# Patient Record
Sex: Female | Born: 1960 | Race: White | Hispanic: No | Smoking: Former smoker
Health system: Southern US, Community
[De-identification: ages and names within clinical notes are randomized; demographics above are authoritative.]

## PROBLEM LIST (undated history)

## (undated) DIAGNOSIS — J439 Emphysema, unspecified: Secondary | ICD-10-CM

## (undated) DIAGNOSIS — Z87442 Personal history of urinary calculi: Secondary | ICD-10-CM

## (undated) DIAGNOSIS — F1910 Other psychoactive substance abuse, uncomplicated: Secondary | ICD-10-CM

## (undated) DIAGNOSIS — R7303 Prediabetes: Secondary | ICD-10-CM

## (undated) DIAGNOSIS — J449 Chronic obstructive pulmonary disease, unspecified: Secondary | ICD-10-CM

## (undated) DIAGNOSIS — F32A Depression, unspecified: Secondary | ICD-10-CM

## (undated) DIAGNOSIS — M810 Age-related osteoporosis without current pathological fracture: Secondary | ICD-10-CM

## (undated) DIAGNOSIS — D649 Anemia, unspecified: Secondary | ICD-10-CM

## (undated) DIAGNOSIS — E785 Hyperlipidemia, unspecified: Secondary | ICD-10-CM

## (undated) DIAGNOSIS — E119 Type 2 diabetes mellitus without complications: Secondary | ICD-10-CM

## (undated) DIAGNOSIS — M069 Rheumatoid arthritis, unspecified: Secondary | ICD-10-CM

## (undated) DIAGNOSIS — N200 Calculus of kidney: Secondary | ICD-10-CM

## (undated) HISTORY — DX: Chronic obstructive pulmonary disease, unspecified: J44.9

## (undated) HISTORY — DX: Type 2 diabetes mellitus without complications: E11.9

## (undated) HISTORY — DX: Emphysema, unspecified: J43.9

## (undated) HISTORY — PX: APPENDECTOMY: SHX54

## (undated) HISTORY — PX: BREAST BIOPSY: SHX20

## (undated) HISTORY — DX: Rheumatoid arthritis, unspecified: M06.9

## (undated) HISTORY — PX: MULTIPLE TOOTH EXTRACTIONS: SHX2053

## (undated) HISTORY — DX: Depression, unspecified: F32.A

## (undated) HISTORY — PX: CHOLECYSTECTOMY: SHX55

## (undated) HISTORY — DX: Anemia, unspecified: D64.9

## (undated) HISTORY — DX: Hyperlipidemia, unspecified: E78.5

## (undated) HISTORY — DX: Other psychoactive substance abuse, uncomplicated: F19.10

## (undated) HISTORY — PX: FRACTURE SURGERY: SHX138

## (undated) HISTORY — PX: TUBAL LIGATION: SHX77

---

## 2020-12-24 ENCOUNTER — Other Ambulatory Visit: Payer: Self-pay

## 2020-12-24 ENCOUNTER — Ambulatory Visit (INDEPENDENT_AMBULATORY_CARE_PROVIDER_SITE_OTHER): Payer: Medicare Other | Admitting: Medical-Surgical

## 2020-12-24 ENCOUNTER — Encounter: Payer: Self-pay | Admitting: Medical-Surgical

## 2020-12-24 VITALS — BP 122/77 | HR 55 | Temp 98.0°F | Ht 65.0 in | Wt 186.9 lb

## 2020-12-24 DIAGNOSIS — Z114 Encounter for screening for human immunodeficiency virus [HIV]: Secondary | ICD-10-CM | POA: Diagnosis not present

## 2020-12-24 DIAGNOSIS — Z7689 Persons encountering health services in other specified circumstances: Secondary | ICD-10-CM

## 2020-12-24 DIAGNOSIS — Z23 Encounter for immunization: Secondary | ICD-10-CM | POA: Diagnosis not present

## 2020-12-24 DIAGNOSIS — M069 Rheumatoid arthritis, unspecified: Secondary | ICD-10-CM | POA: Diagnosis not present

## 2020-12-24 DIAGNOSIS — Z1159 Encounter for screening for other viral diseases: Secondary | ICD-10-CM

## 2020-12-24 DIAGNOSIS — R7303 Prediabetes: Secondary | ICD-10-CM | POA: Insufficient documentation

## 2020-12-24 MED ORDER — METHOTREXATE 2.5 MG PO TABS: ORAL_TABLET | ORAL | 0 refills | Status: DC

## 2020-12-24 MED ORDER — XELJANZ 10 MG PO TABS
5.0000 mg | ORAL_TABLET | Freq: Two times a day (BID) | ORAL | 1 refills | Status: DC
Start: 2020-12-24 — End: 2020-12-26

## 2020-12-24 NOTE — Patient Instructions (Signed)
Influenza (Flu) Vaccine (Inactivated or Recombinant): What You Need to Know 1. Why get vaccinated? Influenza vaccine can prevent influenza (flu). Flu is a contagious disease that spreads around the [place] every year, usually between October and May. Anyone can get the flu, but it is more dangerous for some people. Infants and young children, people 65 years and older, pregnant people, and people with certain health conditions or a weakened immune system are at greatest risk of flu complications. Pneumonia, bronchitis, sinus infections, and ear infections are examples of flu-related complications. If you have a medical condition, such as heart disease, cancer, or diabetes, flu can make it worse. Flu can cause fever and chills, sore throat, muscle aches, fatigue, cough, headache, and runny or stuffy nose. Some people may have vomiting and diarrhea, though this is more common in children than adults. In an average year, thousands of people in the [place] die from flu, and many more are hospitalized. Flu vaccine prevents millions of illnesses and flu-related visits to the doctor each year. 2. Influenza vaccines CDC recommends everyone 6 months and older get vaccinated every flu season. Children 6 months through 8 years of age may need 2 doses during a single flu season. Everyone else needs only 1 dose each flu season. It takes about 2 weeks for protection to develop after vaccination. There are many flu viruses, and they are always changing. Each year a new flu vaccine is made to protect against the influenza viruses believed to be likely to cause disease in the upcoming flu season. Even when the vaccine doesn't exactly match these viruses, it may still provide some protection. Influenza vaccine does not cause flu. Influenza vaccine may be given at the same time as other vaccines. 3. Talk with your health care provider Tell your vaccination provider if the person getting the vaccine:  Has  had an allergic reaction after a previous dose of influenza vaccine, or has any severe, life-threatening allergies  Has ever had Guillain-Barr Syndrome (also called "GBS") In some cases, your health care provider may decide to postpone influenza vaccination until a future visit. Influenza vaccine can be administered at any time during pregnancy. People who are or will be pregnant during influenza season should receive inactivated influenza vaccine. People with minor illnesses, such as a cold, may be vaccinated. People who are moderately or severely ill should usually wait until they recover before getting influenza vaccine. Your health care provider can give you more information. 4. Risks of a vaccine reaction  Soreness, redness, and swelling where the shot is given, fever, muscle aches, and headache can happen after influenza vaccination.  There may be a very small increased risk of Guillain-Barr Syndrome (GBS) after inactivated influenza vaccine (the flu shot). Young children who get the flu shot along with pneumococcal vaccine (PCV13) and/or DTaP vaccine at the same time might be slightly more likely to have a seizure caused by fever. Tell your health care provider if a child who is getting flu vaccine has ever had a seizure. People sometimes faint after medical procedures, including vaccination. Tell your provider if you feel dizzy or have vision changes or ringing in the ears. As with any medicine, there is a very remote chance of a vaccine causing a severe allergic reaction, other serious injury, or death. 5. What if there is a serious problem? An allergic reaction could occur after the vaccinated person leaves the clinic. If you see signs of a severe allergic reaction (hives, swelling of the face   and throat, difficulty breathing, a fast heartbeat, dizziness, or weakness), call 9-1-1 and get the person to the nearest hospital. For other signs that concern you, call your health care  provider. Adverse reactions should be reported to the Vaccine Adverse Event Reporting System (VAERS). Your health care provider will usually file this report, or you can do it yourself. Visit the VAERS website at www.vaers.LAgents.no or call 520-620-1123. VAERS is only for reporting reactions, and VAERS staff members do not give medical advice. 6. The National Vaccine Injury Compensation Program The [employer] Vaccine Injury Compensation Program (VICP) is a federal program that was created to compensate people who may have been injured by certain vaccines. Claims regarding alleged injury or death due to vaccination have a time limit for filing, which may be as short as two years. Visit the VICP website at SpiritualWord.at or call 518 212 0302 to learn about the program and about filing a claim. 7. How can I learn more?  Ask your health care provider.  Call your local or state health department.  Visit the website of the Food and Drug Administration (FDA) for vaccine package inserts and additional information at FinderList.no.  Contact the Centers for Disease Control and Prevention (CDC): ? Call 6807009307 (1-800-CDC-INFO) or ? Visit CDC's website at BiotechRoom.com.cy. Vaccine Information Statement Inactivated Influenza Vaccine (07/19/2020) This information is not intended to replace advice given to you by your health care provider. Make sure you discuss any questions you have with your health care provider. Document Revised: 09/05/2020 Document Reviewed: 09/05/2020 Elsevier Patient Education  2021 Elsevier Inc.    Critical care medicine: Principles of diagnosis and management in the adult (4th ed., pp. 8921-1941). Saunders."> Miller's anesthesia (8th ed., pp. 232-250). Saunders.">  Advance Directive  Advance directives are legal documents that allow you to make decisions about your health care and medical treatment in case you become unable to  communicate for yourself. Advance directives let your wishes be known to family, friends, and health care providers. Discussing and writing advance directives should happen over time rather than all at once. Advance directives can be changed and updated at any time. There are different types of advance directives, such as:  Medical power of attorney.  Living will.  Do not resuscitate (DNR) order or do not attempt resuscitation (DNAR) order. Health care proxy and medical power of attorney A health care proxy is also called a health care agent. This person is appointed to make medical decisions for you when you are unable to make decisions for yourself. Generally, people ask a trusted friend or family member to act as their proxy and represent their preferences. Make sure you have an agreement with your trusted person to act as your proxy. A proxy may have to make a medical decision on your behalf if your wishes are not known. A medical power of attorney, also called a durable power of attorney for health care, is a legal document that names your health care proxy. Depending on the laws in your state, the document may need to be:  Signed.  Notarized.  Dated.  Copied.  Witnessed.  Incorporated into your medical record. You may also want to appoint a trusted person to manage your money in the event you are unable to do so. This is called a durable power of attorney for finances. It is a separate legal document from the durable power of attorney for health care. You may choose your health care proxy or someone different to act as your agent in  money matters. If you do not appoint a proxy, or there is a concern that the proxy is not acting in your best interest, a court may appoint a guardian to act on your behalf. Living will A living will is a set of instructions that state your wishes about medical care when you cannot express them yourself. Health care providers should keep a copy of your  living will in your medical record. You may want to give a copy to family members or friends. To alert caregivers in case of an emergency, you can place a card in your wallet to let them know that you have a living will and where they can find it. A living will is used if you become:  Terminally ill.  Disabled.  Unable to communicate or make decisions. The following decisions should be included in your living will:  To use or not to use life support equipment, such as dialysis machines and breathing machines (ventilators).  Whether you want a DNR or DNAR order. This tells health care providers not to use cardiopulmonary resuscitation (CPR) if breathing or heartbeat stops.  To use or not to use tube feeding.  To be given or not to be given food and fluids.  Whether you want comfort (palliative) care when the goal becomes comfort rather than a cure.  Whether you want to donate your organs and tissues. A living will does not give instructions for distributing your money and property if you should pass away. DNR or DNAR A DNR or DNAR order is a request not to have CPR in the event that your heart stops beating or you stop breathing. If a DNR or DNAR order has not been made and shared, a health care provider will try to help any patient whose heart has stopped or who has stopped breathing. If you plan to have surgery, talk with your health care provider about how your DNR or DNAR order will be followed if problems occur. What if I do not have an advance directive? Some states assign family decision makers to act on your behalf if you do not have an advance directive. Each state has its own laws about advance directives. You may want to check with your health care provider, attorney, or state representative about the laws in your state. Summary  Advance directives are legal documents that allow you to make decisions about your health care and medical treatment in case you become unable to  communicate for yourself.  The process of discussing and writing advance directives should happen over time. You can change and update advance directives at any time.  Advance directives may include a medical power of attorney, a living will, and a DNR or DNAR order. This information is not intended to replace advice given to you by your health care provider. Make sure you discuss any questions you have with your health care provider. Document Revised: 09/03/2020 Document Reviewed: 09/03/2020 Elsevier Patient Education  2021 [employer].

## 2020-12-24 NOTE — Progress Notes (Signed)
New Patient Office Visit  Subjective:  Patient ID: Lori Banks, female    DOB: 07-Mar-1961  Age: 60 y.o. MRN: [id number]  CC:  Chief Complaint  Patient presents with  . Establish Care  . Rheumatoid Arthritis    HPI Lori Banks presents to establish care.   RA- diagnosed 20 years ago. Recently moved to the area and needs to establish with Rheumatology. Taking Xeljanz 5mg  BID, tolerating well. Also taking Methotrexate 7.5mg  BID once weekly.   COPD- stable, reports never having a problem and the diagnosis was incidental. Stopped smoking two years ago and doing well.   Past Medical History:  Diagnosis Date  . COPD (chronic obstructive pulmonary disease) (HCC)   . Rheumatoid arthritis [facility])     Past Surgical History:  Procedure Laterality Date  . APPENDECTOMY    . CESAREAN SECTION    . TUBAL LIGATION      Family History  Problem Relation Age of Onset  . Diabetes Mother   . Osteoarthritis Mother   . Heart disease Father   . Diabetes Sister   . Fibromyalgia Sister   . Meniere's disease Brother     Social History   Socioeconomic History  . Marital status: Legally Separated    Spouse name: Not on file  . Number of children: Not on file  . Years of education: Not on file  . Highest education level: Not on file  Occupational History  . Not on file  Tobacco Use  . Smoking status: Former Smoker    Quit date: 04/2019    Years since quitting: 1.6  . Smokeless tobacco: Never Used  Vaping Use  . Vaping Use: Never used  Substance and Sexual Activity  . Alcohol use: Yes    Comment: occassionally  . Drug use: Yes    Frequency: 3.0 times per week    Types: Marijuana  . Sexual activity: Not Currently  Other Topics Concern  . Not on file  Social History Narrative  . Not on file   Social Determinants of Health   Financial Resource Strain: Not on file  Food Insecurity: Not on file  Transportation Needs: Not on file  Physical Activity: Not on  file  Stress: Not on file  Social Connections: Not on file  Intimate Partner Violence: Not on file    ROS Review of Systems  Constitutional: Negative for chills, fatigue, fever and unexpected weight change.  Respiratory: Negative for cough, chest tightness, shortness of breath and wheezing.   Cardiovascular: Negative for chest pain, palpitations and leg swelling.  Gastrointestinal: Negative for abdominal pain, constipation, diarrhea, nausea and vomiting.  Genitourinary: Negative for dysuria, frequency, hematuria and urgency.  Musculoskeletal: Positive for arthralgias and joint swelling.  Neurological: Negative for dizziness, light-headedness and headaches.  Psychiatric/Behavioral: Negative for dysphoric mood, self-injury, sleep disturbance and suicidal ideas. The patient is not nervous/anxious.     Objective:   Today's Vitals: BP 122/77   Pulse (!) 55   Temp 98 F (36.7 C)   Ht 5\' 5"  (1.651 m)   Wt 186 lb 14.4 oz (84.8 kg)   SpO2 98%   BMI 31.10 kg/m   Physical Exam Vitals reviewed.  Constitutional:      General: She is not in acute distress.    Appearance: Normal appearance.  HENT:     Head: Normocephalic and atraumatic.  Cardiovascular:     Rate and Rhythm: Normal rate and regular rhythm.     Pulses: Normal pulses.  Heart sounds: Normal heart sounds. No murmur heard. No friction rub. No gallop.   Pulmonary:     Effort: Pulmonary effort is normal. No respiratory distress.     Breath sounds: Normal breath sounds. No wheezing.  Skin:    General: Skin is warm and dry.  Neurological:     Mental Status: She is alert and oriented to person, place, and time.  Psychiatric:        Mood and Affect: Mood normal.        Behavior: Behavior normal.        Thought Content: Thought content normal.        Judgment: Judgment normal.     Assessment & Plan:   1. Encounter to establish care Reviewed available information and discussed care with patient.  2.  Prediabetes History of prediabetes.  She has not had blood work in about 1 year but would like to defer labs until she has established with a rheumatologist since they will draw quite a bit of blood work.  3. Rheumatoid arthritis involving multiple sites, unspecified whether rheumatoid factor present [facility]) Referring to rheumatology.  In the meantime, I will go ahead and refill her Harriette Ohara and her methotrexate as she has been on these for a while and is stable. - Ambulatory referral to Rheumatology - Tofacitinib Citrate (XELJANZ) 10 MG TABS; Take 5 mg by mouth in the morning and at bedtime.  Dispense: 30 tablet; Refill: 1 - methotrexate (RHEUMATREX) 2.5 MG tablet; Take 7.5mg  in the morning and with dinner one day per week.  Dispense: 24 tablet; Refill: 0  4. Screening for HIV (human immunodeficiency virus) Deferring screening today since that she is getting labs drawn at rheumatology.  5. Need for hepatitis C screening test Deferring screening today since that she is getting labs drawn at rheumatology.  6. Need for influenza vaccination Flu vaccine given in office today. - Flu Vaccine QUAD 36+ mos IM   Outpatient Encounter Medications as of 12/24/2020  Medication Sig  . albuterol (VENTOLIN HFA) 108 (90 Base) MCG/ACT inhaler Inhale 2 puffs into the lungs every 4 (four) hours as needed for wheezing or shortness of breath.  . diclofenac Sodium (VOLTAREN) 1 % GEL Apply topically 2 (two) times daily as needed (pain).  . folic acid (FOLVITE) 1 MG tablet Take 1 mg by mouth daily.  . [DISCONTINUED] methotrexate (RHEUMATREX) 2.5 MG tablet Take 2.5 mg by mouth 3 (three) times daily. For 1 week  . [DISCONTINUED] Tofacitinib Citrate (XELJANZ) 10 MG TABS Take 5 mg by mouth daily.  . methotrexate (RHEUMATREX) 2.5 MG tablet Take 7.5mg  in the morning and with dinner one day per week.  . Tofacitinib Citrate (XELJANZ) 10 MG TABS Take 5 mg by mouth in the morning and at bedtime.   No facility-administered  encounter medications on file as of 12/24/2020.    Follow-up: Return for annual physical exam at your convenience.   Thayer Ohm, DNP, APRN, FNP-BC [facility]

## 2020-12-26 ENCOUNTER — Other Ambulatory Visit: Payer: Self-pay

## 2020-12-26 DIAGNOSIS — M069 Rheumatoid arthritis, unspecified: Secondary | ICD-10-CM

## 2020-12-26 MED ORDER — XELJANZ 10 MG PO TABS: 5.0000 mg | ORAL_TABLET | Freq: Two times a day (BID) | ORAL | 0 refills | Status: DC

## 2021-01-16 ENCOUNTER — Telehealth: Payer: Self-pay | Admitting: Neurology

## 2021-01-16 NOTE — Telephone Encounter (Signed)
Prior Authorization for [employer] submitted via covermymeds. Awaiting response.

## 2021-01-20 ENCOUNTER — Telehealth: Payer: Self-pay | Admitting: *Deleted

## 2021-01-20 NOTE — Telephone Encounter (Signed)
Junie Bame approved.  Pharmacy notified.
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
## 2021-01-22 ENCOUNTER — Other Ambulatory Visit: Payer: Self-pay

## 2021-01-22 ENCOUNTER — Ambulatory Visit (HOSPITAL_COMMUNITY)
Admission: RE | Admit: 2021-01-22 | Discharge: 2021-01-22 | Disposition: A | Discharge status: Home / Self Care | Payer: Medicare Other | Source: Ambulatory Visit | Attending: Rheumatology | Admitting: Rheumatology

## 2021-01-22 ENCOUNTER — Ambulatory Visit: Payer: Self-pay

## 2021-01-22 ENCOUNTER — Encounter: Payer: Self-pay | Admitting: Medical-Surgical

## 2021-01-22 ENCOUNTER — Ambulatory Visit (INDEPENDENT_AMBULATORY_CARE_PROVIDER_SITE_OTHER): Payer: Medicare Other | Admitting: Rheumatology

## 2021-01-22 ENCOUNTER — Encounter: Payer: Self-pay | Admitting: Rheumatology

## 2021-01-22 ENCOUNTER — Telehealth: Payer: Self-pay | Admitting: Pharmacist

## 2021-01-22 VITALS — BP 135/79 | HR 53 | Resp 15 | Ht 65.0 in | Wt 192.2 lb

## 2021-01-22 DIAGNOSIS — R7303 Prediabetes: Secondary | ICD-10-CM

## 2021-01-22 DIAGNOSIS — M79642 Pain in left hand: Secondary | ICD-10-CM | POA: Diagnosis not present

## 2021-01-22 DIAGNOSIS — Z79899 Other long term (current) drug therapy: Secondary | ICD-10-CM | POA: Insufficient documentation

## 2021-01-22 DIAGNOSIS — M0579 Rheumatoid arthritis with rheumatoid factor of multiple sites without organ or systems involvement: Secondary | ICD-10-CM | POA: Diagnosis not present

## 2021-01-22 DIAGNOSIS — Z13820 Encounter for screening for osteoporosis: Secondary | ICD-10-CM

## 2021-01-22 DIAGNOSIS — Z8709 Personal history of other diseases of the respiratory system: Secondary | ICD-10-CM

## 2021-01-22 DIAGNOSIS — M79641 Pain in right hand: Secondary | ICD-10-CM

## 2021-01-22 DIAGNOSIS — R5383 Other fatigue: Secondary | ICD-10-CM

## 2021-01-22 DIAGNOSIS — Z87891 Personal history of nicotine dependence: Secondary | ICD-10-CM

## 2021-01-22 DIAGNOSIS — M79671 Pain in right foot: Secondary | ICD-10-CM | POA: Diagnosis not present

## 2021-01-22 DIAGNOSIS — M79672 Pain in left foot: Secondary | ICD-10-CM

## 2021-01-22 DIAGNOSIS — Z7189 Other specified counseling: Secondary | ICD-10-CM

## 2021-01-22 MED ORDER — FOLIC ACID 1 MG PO TABS: 1.0000 mg | ORAL_TABLET | Freq: Every day | ORAL | 3 refills | Status: DC

## 2021-01-22 NOTE — Progress Notes (Signed)
Pharmacy Note  Subjective: Patient presents today to [facility] for initial office visit with Dr. Corliss Skains to establish care - she recently moved from [place]. She currently has [place] [employer].  Patient seen by the pharmacist for counseling on Xeljanz and methotrexate for rheumatoid arthritis. She reports doing well on this combination medication - no adverse reactions since starting either.  Objective:  No baseline labs on file. Drawn today: - CBC, CMP, Quantiferon TB gold - Lipid panel - IgG, IgA, IgM - Serum protein electrophoresis - HIV antibody routine testing w/ reflex - Hep C antibiody - Hep B surface antigen, Hep B core antibody (IgM) - ANA - TSH - CK - Sedimentation rate  Chest x-ray: none on file; patient states she hasn't had chest x-ray since starting MTX  Does patient have history of diverticulitis?  No  Assessment/Plan: Counseled patient that Harriette Ohara is a JAK inhibitor.  Counseled patient on purpose, proper use, and adverse effects of Xeljanz.  Reviewed the most common adverse effects including infection, diarrhea, headaches. Counseled on the increased risk of DVT/PE.  Also reviewed rare adverse effects such as bowel injury and the need to contact us if develops stomach pain during treatment.  Reviewed with patient that there is the possibility of an increased risk of malignancy but it is not well understood if this increased risk is due to the medication or the disease state.  Counseled that Harriette Ohara should be held for infection and prior to surgery.  Counseled patient to avoid live vaccines while on [place].  Recommend annual influenza, Pneumovax 23, Prevnar 13, and Shingrix as indicated.   Reviewed importance of routine lab monitoring including a lipid panel.  Standing orders placed and patient is to return every 3 months for labs. Provided patient with medication education material and answered all questions.  Patient consented to [place].  Will upload  Harriette Ohara into patient's chart.  Counseled patient of proper use, storage,  and dosing of Methotrexate.  Counseled patient on avoiding live vaccines, discontinuing methotrexate for surgical procedures, and any time patient is placed on Antibiotics. Also counseled on the importance of lab monitor every 3 months.  Patient consented to Methotrexate. Will upload consent in the media tab. Standing orders placed for lab monitor all questions were encouraged and answered.   Patient provided with directions to get baseline chest x-ray.  Patient has received 2 Moderna vaccines doses as well as booster. Advised that she is eligible for 4th dose and should hold methotrexate and Xeljanz for 1 week after taking vaccine.  Patient will remain on: --  Xeljanz 5mg  ER twice daily --  Methotrexate 7.5mg  in morning and with dinner every 7 days. Patient reports that she takes folic acid 1 mg once daily.  Patient requests folic acid prescription to be sent to [facility]. She filled [facility] 2 weeks ago. Reports that she has enough methotrexate and Xeljanz on hand.  Will call insurance to request Harriette Ohara prior auth approval letter be faxed to our office to scan into media tab.

## 2021-01-22 NOTE — Patient Instructions (Addendum)
COVID-19 vaccine recommendations:   COVID-19 vaccine is recommended for everyone (unless you are allergic to a vaccine component), even if you are on a medication that suppresses your immune system.   If you are on Methotrexate, Cellcept (mycophenolate), Rinvoq, Lori Banks, and Olumiant- hold the medication for 1 week after each vaccine. Hold Methotrexate for 2 weeks after the single dose COVID-19 vaccine.   Immunocompromised individuals advised to get COVID-19 vaccine first 3 doses 1 month apart and then a fourth dose (booster) 6 months after the third dose.   Do not take Tylenol or any anti-inflammatory medications (NSAIDs) 24 hours prior to the COVID-19 vaccination.   There is no direct evidence about the efficacy of the COVID-19 vaccine in individuals who are on medications that suppress the immune system.   Even if you are fully vaccinated, and you are on any medications that suppress your immune system, please continue to wear a mask, maintain at least six feet social distance and practice hand hygiene.   If you develop a COVID-19 infection, please contact your PCP or our office to determine if you need monoclonal antibody infusion.  The booster vaccine is now available for immunocompromised patients.   Please see the following web sites for updated information.   [email or web address]  Vaccines You are taking a medication(s) that can suppress your immune system.  The following immunizations are recommended: . Flu annually . Covid-19  . Pneumonia (Pneumovax 23 and Prevnar 13 spaced at least 1 year apart) . Shingrix (after age 25)  Please check with your PCP to make sure you are up to date.  Methotrexate tablets What is this medicine? METHOTREXATE (METH oh TREX ate) is a chemotherapy drug used to treat cancer including breast cancer, leukemia, and lymphoma. This medicine can also be used to treat psoriasis and  certain kinds of arthritis. This medicine may be used for other purposes; ask your health care provider or pharmacist if you have questions. COMMON BRAND NAME(S): Rheumatrex, Trexall What should I tell my health care provider before I take this medicine? They need to know if you have any of these conditions:  fluid in the stomach area or lungs  if you often drink alcohol  infection or immune system problems  kidney disease or on hemodialysis  liver disease  low blood counts, like low white cell, platelet, or red cell counts  lung disease  radiation therapy  stomach ulcers  ulcerative colitis  an unusual or allergic reaction to methotrexate, other medicines, foods, dyes, or preservatives  pregnant or trying to get pregnant  breast-feeding How should I use this medicine? Take this medicine by mouth with a glass of water. Follow the directions on the prescription label. Take your medicine at regular intervals. Do not take it more often than directed. Do not stop taking except on your doctor's advice. Make sure you know why you are taking this medicine and how often you should take it. If this medicine is used for a condition that is not cancer, like arthritis or psoriasis, it should be taken weekly, NOT daily. Taking this medicine more often than directed can cause serious side effects, even death. Talk to your healthcare provider about safe handling and disposal of this medicine. You may need to take special precautions. Talk to your pediatrician regarding the use of this medicine in children. While this drug may be prescribed for selected conditions, precautions do apply. Overdosage: If you think you have taken too much of this medicine contact a poison control  center or emergency room at once. NOTE: This medicine is only for you. Do not share this medicine with others. What if I miss a dose? If you miss a dose, talk with your doctor or health care professional. Do not take  double or extra doses. What may interact with this medicine? Do not take this medicine with any of the following medications:  acitretin This medicine may also interact with the following medication:  aspirin and aspirin-like medicines including salicylates  azathioprine  certain antibiotics like penicillins, tetracycline, and chloramphenicol  certain medicines that treat or prevent blood clots like warfarin, apixaban, dabigatran, and rivaroxaban  certain medicines for stomach problems like esomeprazole, omeprazole, pantoprazole  cyclosporine  dapsone  diuretics  gold  hydroxychloroquine  live virus vaccines  medicines for infection like acyclovir, adefovir, amphotericin B, bacitracin, cidofovir, foscarnet, ganciclovir, gentamicin, pentamidine, vancomycin  mercaptopurine  NSAIDs, medicines for pain and inflammation, like ibuprofen or naproxen  other cytotoxic agents  pamidronate  pemetrexed  penicillamine  phenylbutazone  phenytoin  probenecid  pyrimethamine  retinoids such as isotretinoin and tretinoin  steroid medicines like prednisone or cortisone  sulfonamides like sulfasalazine and trimethoprim/sulfamethoxazole  theophylline  zoledronic acid This list may not describe all possible interactions. Give your health care provider a list of all the medicines, herbs, non-prescription drugs, or dietary supplements you use. Also tell them if you smoke, drink alcohol, or use illegal drugs. Some items may interact with your medicine. What should I watch for while using this medicine? Avoid alcoholic drinks. This medicine can make you more sensitive to the sun. Keep out of the sun. If you cannot avoid being in the sun, wear protective clothing and use sunscreen. Do not use sun lamps or tanning beds/booths. You may need blood work done while you are taking this medicine. Call your doctor or health care professional for advice if you get a fever, chills or sore  throat, or other symptoms of a cold or flu. Do not treat yourself. This drug decreases your body's ability to fight infections. Try to avoid being around people who are sick. This medicine may increase your risk to bruise or bleed. Call your doctor or health care professional if you notice any unusual bleeding. Be careful brushing or flossing your teeth or using a toothpick because you may get an infection or bleed more easily. If you have any dental work done, tell your dentist you are receiving this medicine. Check with your doctor or health care professional if you get an attack of severe diarrhea, nausea and vomiting, or if you sweat a lot. The loss of too much body fluid can make it dangerous for you to take this medicine. Talk to your doctor about your risk of cancer. You may be more at risk for certain types of cancers if you take this medicine. Do not become pregnant while taking this medicine or for 6 months after stopping it. Women should inform their health care provider if they wish to become pregnant or think they might be pregnant. Men should not father a child while taking this medicine and for 3 months after stopping it. There is potential for serious harm to an unborn child. Talk to your health care provider for more information. Do not breast-feed an infant while taking this medicine or for 1 week after stopping it. This medicine may make it more difficult to get pregnant or father a child. Talk to your health care provider if you are concerned about your fertility. What side  effects may I notice from receiving this medicine? Side effects that you should report to your doctor or health care professional as soon as possible:  allergic reactions like skin rash, itching or hives, swelling of the face, lips, or tongue  breathing problems or shortness of breath  diarrhea  dry, nonproductive cough  low blood counts - this medicine may decrease the number of white blood cells, red blood  cells and platelets. You may be at increased risk for infections and bleeding.  mouth sores  redness, blistering, peeling or loosening of the skin, including inside the mouth  signs of infection - fever or chills, cough, sore throat, pain or trouble passing urine  signs and symptoms of bleeding such as bloody or black, tarry stools; red or dark-brown urine; spitting up blood or brown material that looks like coffee grounds; red spots on the skin; unusual bruising or bleeding from the eye, gums, or nose  signs and symptoms of kidney injury like trouble passing urine or change in the amount of urine  signs and symptoms of liver injury like dark yellow or brown urine; general ill feeling or flu-like symptoms; light-colored stools; loss of appetite; nausea; right upper belly pain; unusually weak or tired; yellowing of the eyes or skin Side effects that usually do not require medical attention (report to your doctor or health care professional if they continue or are bothersome):  dizziness  hair loss  tiredness  upset stomach  vomiting This list may not describe all possible side effects. Call your doctor for medical advice about side effects. You may report side effects to FDA at 1-800-FDA-1088. Where should I keep my medicine? Keep out of the reach of children and pets. Store at room temperature between 20 and 25 degrees C (68 and 77 degrees F). Protect from light. Get rid of any unused medicine after the expiration date. Talk to your health care provider about how to dispose of unused medicine. Special directions may apply. NOTE: This sheet is a summary. It may not cover all possible information. If you have questions about this medicine, talk to your doctor, pharmacist, or health care provider.  2021 Elsevier/Gold Standard (2020-07-01 10:40:39) Tofacitinib Oral Tablets What is this medicine? TOFACITINIB (TOE fa SYE ti nib) is a medicine that works on the immune system. This medicine  is used to treat rheumatoid arthritis, psoriatic arthritis, and polyarticular juvenile idiopathic arthritis. It is also used to treat ulcerative colitis. This medicine may be used for other purposes; ask your health care provider or pharmacist if you have questions. COMMON BRAND NAME(S): Lori Banks What should I tell my health care provider before I take this medicine? They need to know if you have any of these conditions:  blood clots  cancer  diabetes (high blood sugar)  heart disease  high blood pressure  high cholesterol  HIV or AIDS  immune system problems  infection especially a virus infection such as chickenpox, cold sores, hepatitis B or herpes  infection such as tuberculosis (TB) or other bacterial, fungal or viral infections  kidney disease  liver disease  low blood counts (white cells, platelets, or red blood cells)  lung or breathing disease (asthma, COPD)  organ transplant  smoke tobacco cigarettes  stomach or intestine problems  an unusual or allergic reaction to tofacitinib, other medicines, foods, dyes, or preservatives  pregnant or trying to get pregnant  breast-feeding How should I use this medicine? Take this medicine by mouth with water. Take it as directed on  the prescription label. You can take it with or without food. If it upsets your stomach, take it with food. Keep taking it unless your health care provider tells you to stop. A special MedGuide will be given to you by the pharmacist with each prescription and refill. Be sure to read this information carefully each time. Talk to your health care provider about the use of this medicine in children. While this drug may be prescribed for children as young as 2 years for selected conditions, precautions do apply. Overdosage: If you think you have taken too much of this medicine contact a poison control center or emergency room at once. NOTE: This medicine is only for you. Do not share this medicine  with others. What if I miss a dose? If you miss a dose, take it as soon as you can. If it is almost time for your next dose, take only that dose. Do not take double or extra doses. What may interact with this medicine? Do not take this medicine with any of the following medications:  upadacitinib This medicine may also interact with the following medications:  antiviral medicines for hepatitis, HIV or AIDS  azathioprine  biologic medicines such as abatacept, adalimumab, anakinra, certolizumab, etanercept, golimumab, infliximab, ofatumumab, rituximab, sarilumab, secukinumab, tocilizumab, ustekinumab, vedolizumab  certain medicines for fungal infections like fluconazole, itraconazole, ketoconazole, voriconazole  certain medicines for seizures like carbamazepine, phenobarbital, phenytoin  cyclosporine  live vaccines  medicines that lower your chance of fighting infection  rifampin  supplements, such as St. John's wort  tacrolimus This list may not describe all possible interactions. Give your health care provider a list of all the medicines, herbs, non-prescription drugs, or dietary supplements you use. Also tell them if you smoke, drink alcohol, or use illegal drugs. Some items may interact with your medicine. What should I watch for while using this medicine? Visit your health care provider for regular checks on your progress. Tell your health care provider if your symptoms do not start to get better or if they get worse. You may need blood work while you are taking this medicine. This medicine may increase your risk of getting an infection. Call your health care provider for advice if you get a fever, chills, sore throat, or other symptoms of a cold or flu. Do not treat yourself. Try to avoid being around people who are sick. Avoid taking medicines that contain aspirin, acetaminophen, ibuprofen, naproxen, or ketoprofen unless instructed by your health care provider. These medicines  may hide a fever. Talk to your health care provider about your risk of cancer. You may be more at risk for certain types of cancer if you take this medicine. Do not become pregnant while taking this medicine. Women should inform their health care provider if they wish to become pregnant or think they might be pregnant. There is potential for serious harm to an unborn child. Talk to your health care provider for more information. Do not breast-feed an infant while taking this medicine or for at least 18 hours after stopping it. What side effects may I notice from receiving this medicine? Side effects that you should report to your doctor or health care professional as soon as possible:  allergic reactions (skin rash, itching or hives; swelling of the face, lips, or tongue)  blood clot (chest pain; shortness of breath; pain, swelling, or warmth in the leg)  heart attack (trouble breathing; pain or tightness in the chest, neck, back or arms; unusually weak or  tired)  infection (fever, chills, cough, sore throat, pain or trouble passing urine)  light colored stool  liver injury (dark yellow or brown urine; general ill feeling or flu-like symptoms; loss of appetite, right upper belly pain; unusually weak or tired, yellowing of the eyes or skin)  low red blood cell counts (trouble breathing; feeling faint; lightheaded, falls; unusually weak or tired)  stroke (changes in vision; confusion; trouble speaking or understanding; severe headaches; sudden numbness or weakness of the face, arm or leg; trouble walking; dizziness; loss of balance or coordination)  tears in the stomach or intestines (fever; stomach pain; sudden change in bowel habits) Side effects that usually do not require medical attention (report to your doctor or health care professional if they continue or are bothersome):  diarrhea  headache  muscle pain  nasal congestion (runny or stuffy nose) This list may not describe all  possible side effects. Call your doctor for medical advice about side effects. You may report side effects to FDA at 1-800-FDA-1088. Where should I keep my medicine? Keep out of the reach of children and pets. Store at room temperature between 20 and 25 degrees C (68 and 77 degrees F). Get rid of any unused medicine after the expiration date. To get rid of medicines that are no longer needed or have expired:  Take the medicine to a medicine take-back program. Check with your pharmacy or law enforcement to find a location.  If you cannot return the medicine, check the label or package insert to see if the medicine should be thrown out in the garbage or flushed down the toilet. If you are not sure, ask your health care provider. If it is safe to put it in the trash, empty the medicine out of the container. Mix the medicine with cat litter, dirt, coffee grounds, or other unwanted substance. Seal the mixture in a bag or container. Put it in the trash. NOTE: This sheet is a summary. It may not cover all possible information. If you have questions about this medicine, talk to your doctor, pharmacist, or health care provider.  2021 Elsevier/Gold Standard (2020-08-16 13:06:21)  Standing Labs We placed an order today for your standing lab work.   Please have your standing labs drawn in May and every 3 months  If possible, please have your labs drawn 2 weeks prior to your appointment so that the provider can discuss your results at your appointment.  We have open lab daily Monday through Thursday from 1:30-4:30 PM and Friday from 1:30-4:00 PM at the office of Dr. Pollyann Savoy, [facility].   Please be advised, all patients with office appointments requiring lab work will take precedents over walk-in lab work.  If possible, please come for your lab work on Monday and Friday afternoons, as you may experience shorter wait times. The office is located at [address], [address],  [place], [place] [address] No appointment is necessary.   Labs are drawn by [facility]. Please bring your co-pay at the time of your lab draw.  You may receive a bill from [facility] for your lab work.  If you wish to have your labs drawn at another location, please call the office 24 hours in advance to send orders.  If you have any questions regarding directions or hours of operation,  please call [phone].   As a reminder, please drink plenty of water prior to coming for your lab work. Thanks!

## 2021-01-22 NOTE — Telephone Encounter (Addendum)
Called patient's insurance regarding Lori Banks prior authorization to request copy of approval letter to be sent to scan center. Provided clinic fax number.  Patient approved from 01/17/21 until further notice.  Case ID: [id number] Call reference #: [id number]  Chesley Mires, PharmD, MPH Clinical Pharmacist (Rheumatology and Pulmonology)

## 2021-01-27 LAB — COMPLETE METABOLIC PANEL WITH GFR
AG Ratio: 1.8 (calc) (ref 1.0–2.5)
ALT: 19 U/L (ref 6–29)
AST: 17 U/L (ref 10–35)
Albumin: 4.2 g/dL (ref 3.6–5.1)
Alkaline phosphatase (APISO): 41 U/L (ref 37–153)
BUN: 21 mg/dL (ref 7–25)
CO2: 24 mmol/L (ref 20–32)
Calcium: 10.2 mg/dL (ref 8.6–10.4)
Chloride: 106 mmol/L (ref 98–110)
Creat: 0.81 mg/dL (ref 0.50–0.99)
GFR, Est African American: 91 mL/min/{1.73_m2} (ref >=60)
GFR, Est Non African American: 79 mL/min/{1.73_m2} (ref >=60)
Globulin: 2.3 g/dL (calc) (ref 1.9–3.7)
Glucose, Bld: 88 mg/dL (ref 65–99)
Potassium: 4.4 mmol/L (ref 3.5–5.3)
Sodium: 141 mmol/L (ref 135–146)
Total Bilirubin: 0.5 mg/dL (ref 0.2–1.2)
Total Protein: 6.5 g/dL (ref 6.1–8.1)

## 2021-01-27 LAB — CBC WITH DIFFERENTIAL/PLATELET
Absolute Monocytes: 454 cells/uL (ref 200–950)
Basophils Absolute: 22 cells/uL (ref 0–200)
Basophils Relative: 0.4 %
Eosinophils Absolute: 101 cells/uL (ref 15–500)
Eosinophils Relative: 1.8 %
HCT: 40.7 % (ref 35.0–45.0)
Hemoglobin: 13.4 g/dL (ref 11.7–15.5)
Lymphs Abs: 1294 cells/uL (ref 850–3900)
MCH: 31.1 pg (ref 27.0–33.0)
MCHC: 32.9 g/dL (ref 32.0–36.0)
MCV: 94.4 fL (ref 80.0–100.0)
MPV: 11.3 fL (ref 7.5–12.5)
Monocytes Relative: 8.1 %
Neutro Abs: 3730 cells/uL (ref 1500–7800)
Neutrophils Relative %: 66.6 %
Platelets: 230 10*3/uL (ref 140–400)
RBC: 4.31 10*6/uL (ref 3.80–5.10)
RDW: 13.6 % (ref 11.0–15.0)
Total Lymphocyte: 23.1 %
WBC: 5.6 10*3/uL (ref 3.8–10.8)

## 2021-01-27 LAB — ANA: Anti Nuclear Antibody (ANA): NEGATIVE

## 2021-01-27 LAB — LIPID PANEL
Cholesterol: 193 mg/dL (ref <200)
HDL: 50 mg/dL (ref >=50)
LDL Cholesterol (Calc): 119 mg/dL (calc) — ABNORMAL HIGH
Non-HDL Cholesterol (Calc): 143 mg/dL (calc) — ABNORMAL HIGH (ref <130)
Total CHOL/HDL Ratio: 3.9 (calc) (ref <5.0)
Triglycerides: 129 mg/dL (ref <150)

## 2021-01-27 LAB — PROTEIN ELECTROPHORESIS, SERUM, WITH REFLEX
Albumin ELP: 4.2 g/dL (ref 3.8–4.8)
Alpha 1: 0.3 g/dL (ref 0.2–0.3)
Alpha 2: 0.7 g/dL (ref 0.5–0.9)
Beta 2: 0.3 g/dL (ref 0.2–0.5)
Beta Globulin: 0.4 g/dL (ref 0.4–0.6)
Gamma Globulin: 0.7 g/dL — ABNORMAL LOW (ref 0.8–1.7)
Total Protein: 6.7 g/dL (ref 6.1–8.1)

## 2021-01-27 LAB — IGG, IGA, IGM
IgG (Immunoglobin G), Serum: 775 mg/dL (ref 600–1640)
IgM, Serum: 114 mg/dL (ref 50–300)
Immunoglobulin A: 198 mg/dL (ref 47–310)

## 2021-01-27 LAB — IFE INTERPRETATION: Immunofix Electr Int: NOT DETECTED

## 2021-01-27 LAB — SEDIMENTATION RATE: Sed Rate: 11 mm/h (ref 0–30)

## 2021-01-27 LAB — QUANTIFERON-TB GOLD PLUS
Mitogen-NIL: 3.82 IU/mL
NIL: 0.05 IU/mL
QuantiFERON-TB Gold Plus: NEGATIVE
TB1-NIL: 0 IU/mL
TB2-NIL: 0 IU/mL

## 2021-01-27 LAB — CK: Total CK: 117 U/L (ref 29–143)

## 2021-01-27 LAB — HEPATITIS B SURFACE ANTIGEN: Hepatitis B Surface Ag: NONREACTIVE

## 2021-01-27 LAB — TSH: TSH: 2.34 mIU/L (ref 0.40–4.50)

## 2021-01-27 LAB — HIV ANTIBODY (ROUTINE TESTING W REFLEX): HIV 1&2 Ab, 4th Generation: NONREACTIVE

## 2021-01-27 LAB — HEPATITIS B CORE ANTIBODY, IGM: Hep B C IgM: NONREACTIVE

## 2021-01-27 LAB — HEPATITIS C ANTIBODY
Hepatitis C Ab: NONREACTIVE
SIGNAL TO CUT-OFF: 0 (ref <1.00)

## 2021-01-27 NOTE — Progress Notes (Signed)
I will discuss results at the follow-up visit.

## 2021-02-09 NOTE — Progress Notes (Signed)
Office Visit Note  Patient: Lori Banks             Date of Birth: 01/14/1961           MRN: [id number]             PCP: Christen Butter, NP Referring: Christen Butter, NP Visit Date: 02/13/2021 Occupation: @GUAROCC @  Subjective:  Medication management   History of Present Illness: Lori Banks is a 60 y.o. female with seropositive erosive rheumatoid arthritis.  She states she continues to have some pain and stiffness in her joints.  She has not noticed any increased joint swelling.  She has been taking her medications on a regular basis and has been tolerating them well.  She states the swelling has come down since last visit.  She had a lot of pain and swelling and flare when she moved from [place] in October.  She is planning to drive back to [place] next week.  Activities of Daily Living:  Patient reports morning stiffness for 1 hour.   Patient Denies nocturnal pain.  Difficulty dressing/grooming: Denies Difficulty climbing stairs: Denies Difficulty getting out of chair: Denies Difficulty using hands for taps, buttons, cutlery, and/or writing: Denies  Review of Systems  Constitutional: Positive for fatigue.  HENT: Positive for mouth dryness.   Eyes: Positive for dryness.  Respiratory: Negative for shortness of breath.   Cardiovascular: Negative for swelling in legs/feet.  Gastrointestinal: Negative for constipation.  Endocrine: Negative for excessive thirst.  Genitourinary: Negative for difficulty urinating.  Musculoskeletal: Positive for arthralgias, gait problem, joint pain, joint swelling and morning stiffness.  Skin: Negative for rash.  Allergic/Immunologic: Negative for susceptible to infections.  Hematological: Positive for bruising/bleeding tendency.  Psychiatric/Behavioral: Negative for sleep disturbance.    PMFS History:  Patient Active Problem List   Diagnosis Date Noted   Rheumatoid arthritis involving multiple sites [facility]) 12/24/2020    Prediabetes 12/24/2020    Past Medical History:  Diagnosis Date   COPD (chronic obstructive pulmonary disease) (HCC)    Rheumatoid arthritis (HCC)     Family History  Problem Relation Age of Onset   Diabetes Mother    Osteoarthritis Mother    Heart disease Father    Diabetes Sister    Fibromyalgia Sister    Meniere's disease Brother    Healthy Son    Healthy Son    Seizures Son    Healthy Daughter    Epilepsy Nephew    Past Surgical History:  Procedure Laterality Date   APPENDECTOMY     CESAREAN SECTION     MULTIPLE TOOTH EXTRACTIONS     all teeth have been removed, patient wears dentures    TUBAL LIGATION     Social History   Social History Narrative   Not on file   Immunization History  Administered Date(s) Administered   Influenza,inj,Quad PF,6+ Mos 12/24/2020   Moderna Sars-Covid-2 Vaccination 03/20/2020, 04/18/2020, 07/05/2020     Objective: Vital Signs: BP 112/67 (BP Location: Left Arm, Patient Position: Sitting, Cuff Size: Normal)    Pulse 61    Resp 16    Ht 5\' 5"  (1.651 m)    Wt 195 lb (88.5 kg)    BMI 32.45 kg/m    Physical Exam Vitals and nursing note reviewed.  Constitutional:      Appearance: She is well-developed and well-nourished.  HENT:     Head: Normocephalic and atraumatic.  Eyes:     Extraocular Movements: EOM normal.     Conjunctiva/sclera:  Conjunctivae normal.  Cardiovascular:     Rate and Rhythm: Normal rate and regular rhythm.     Pulses: Intact distal pulses.     Heart sounds: Normal heart sounds.  Pulmonary:     Effort: Pulmonary effort is normal.     Breath sounds: Normal breath sounds.  Abdominal:     General: Bowel sounds are normal.     Palpations: Abdomen is soft.  Musculoskeletal:     Cervical back: Normal range of motion.  Lymphadenopathy:     Cervical: No cervical adenopathy.  Skin:    General: Skin is warm and dry.     Capillary Refill: Capillary refill takes less than 2 seconds.   Neurological:     Mental Status: She is alert and oriented to person, place, and time.  Psychiatric:        Mood and Affect: Mood and affect normal.        Behavior: Behavior normal.      Musculoskeletal Exam: C-spine was in good range of motion.  Shoulder joints, elbow joints, wrist joints with good range of motion.  She has synovial thickening over MCPs and PIPs as described below.  She had incomplete fist formation.  Hip joints and knee joints in good range of motion.  She had subluxation of bilateral ankles and thickening of her right ankle joint.  No tenderness over MTPs was noted today.  CDAI Exam: CDAI Score: 2.6  Patient Global: 3 mm; Provider Global: 3 mm Swollen: 3 ; Tender: 0  Joint Exam 02/13/2021      Right  Left  MCP 2  Swollen      MCP 3  Swollen      Ankle  Swollen         Investigation: No additional findings.  Imaging: DG Chest 2 View  Result Date: 01/22/2021 CLINICAL DATA:  High risk medication use. EXAM: CHEST - 2 VIEW COMPARISON:  None. FINDINGS: Lungs clear. Heart size normal. No pneumothorax or pleural fluid. No acute or focal bony abnormality. IMPRESSION: Negative chest. Electronically Signed   By: Drusilla Kanner M.D.   On: 01/22/2021 13:56   XR Foot 2 Views Left  Result Date: 01/22/2021 Narrowing of all MTP joints was noted.  Severe narrowing of the first, fourth and fifth MTPs was noted.  Erosive changes were noted in the all MTP joints.  PIP and DIP narrowing was noted.  Intertarsal joint narrowing and dorsal spurring was noted.  Tibiotalar and subtalar joint space narrowing was noted.  Inferior and posterior calcaneal spurs were noted. 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  XR Foot 2 Views Right  Result Date: 01/22/2021 Narrowing of all MTP joints was noted.  Severe narrowing of first, second and fifth MTPs was noted.  Erosive changes were noted in the first, third, fifth MTPs.  PIP and DIP narrowing was  noted.  Erosion was noted at the base of the second metatarsal.  Dorsal spurring was noted.  Tibiotalar and subtalar narrowing was noted.  Inferior calcaneal spur was noted. 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  XR Hand 2 View Left  Result Date: 01/22/2021 Juxta-articular osteopenia was noted.  Narrowing of all MCP, PIP and DIP joints was noted.  Severe narrowing of the second and third MCP joint was noted.  Intercarpal and radiocarpal joint space narrowing was noted.  Possible erosive versus cystic changes were noted in the second and third MCP joints. Impression: These findings are consistent  with severe rheumatoid arthritis.  XR Hand 2 View Right  Result Date: 01/22/2021 Juxta-articular osteopenia was noted.  Narrowing of all MCP joints with severe narrowing of the first, second and third narrowing was noted.  Erosive changes were noted in the first, second and third MCP joints.  Narrowing of PIP and DIP joints was noted.  Mild intercarpal and radiocarpal joint space narrowing was noted. 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   Recent Labs: Lab Results  Component Value Date   WBC 5.6 01/22/2021   HGB 13.4 01/22/2021   PLT 230 01/22/2021   NA 141 01/22/2021   K 4.4 01/22/2021   CL 106 01/22/2021   CO2 24 01/22/2021   GLUCOSE 88 01/22/2021   BUN 21 01/22/2021   CREATININE 0.81 01/22/2021   BILITOT 0.5 01/22/2021   AST 17 01/22/2021   ALT 19 01/22/2021   PROT 6.5 01/22/2021   PROT 6.7 01/22/2021   CALCIUM 10.2 01/22/2021   GFRAA 91 01/22/2021   QFTBGOLDPLUS NEGATIVE 01/22/2021   January 22, 2021 SPEP consistent with hypogammaglobulinemia, IFE negative, HIV negative, hepatitis B-, hepatitis C negative, immunoglobulins normal, TB Gold negative, LDL 119, sed rate 11, CK 117, TSH normal, ANA negative   Speciality Comments: MTX+PLQ+Prednisonex 51yrs.,Remicade +MTX x 15yrs, Orenciax1 only, Xeljanz +  MTX 6tabs po qwk since2018.  Procedures:  No procedures performed Allergies: Patient has no known allergies.   Assessment / Plan:     Visit Diagnoses: Rheumatoid arthritis with rheumatoid factor of multiple sites without organ or systems involvement (HCC) - Positive RF, positive anti-CCP, elevated CRP, severe erosive rheumatoid arthritis with nodulosis since patient was in her 70s.  Diagnosed in [place].  Patient has treated with multiple medication regimens in the past.  She is currently on methotrexate and Xeljanz combination since 2018.  She complains of intermittent flares.  She had 1 major flare when she drove from [place] to [place] in October 2021.  The symptoms have gradually improved.  She currently does not have much swelling.  Left findings from the last visit were discussed at length.  She is planning to drive back to [place] for several hours.  Have advised her to increase fluid intake and take breaks frequently.  She may also consider taking a baby aspirin on the day of her trip.  Increase of clotting with solvents was emphasized.  High risk medication use - MTX+PLQ+Prednisonex 36yrs.,Remicade +MTX x 22yrs, Orenciax1 only, Xeljanz 5mg  po bid + MTX 6tabs po qwk, and folic acid 1mg  po qd since2018.  Her labs were normal in February.  She will get labs every 3 months to monitor for drug toxicity.  LDL is mildly elevated.  She will follow up with her PCP.  She has been advised to get Shingrix vaccine.  She gives history of having shingles twice in the past.  She will also get fourth dose of COVID-19 vaccination.  Pain in both hands - positive synovial thickening was noted.  X-rays were consistent with severe erosive rheumatoid arthritis.  X-ray findings were discussed with the patient.  Pain in both feet - Severe erosive rheumatoid arthritis was noted on the x-rays.  She has subluxation of the ankle joints and overcrowding of the toes.  X-ray findings were discussed with the  patient.  Prediabetes - On dietary management only.  Her blood glucose level was normal at the last visit.  History of COPD  Former smoker - 11/2 PPD X46 years, quit 2020.  I have  advised her to get the CT is scan to screen for lung cancer  Osteoporosis screening -I will schedule DEXA scan.Marland Kitchen  Postmenopausal  Orders: Orders Placed This Encounter  Procedures   DG BONE DENSITY (DXA)   No orders of the defined types were placed in this encounter.     Follow-Up Instructions: Return in about 3 months (around 05/16/2021) for Rheumatoid arthritis.   Pollyann Savoy, MD  Note - This record has been created using Animal nutritionist.  Chart creation errors have been sought, but may not always  have been located. Such creation errors do not reflect on  the standard of medical care.

## 2021-02-13 ENCOUNTER — Other Ambulatory Visit: Payer: Self-pay

## 2021-02-13 ENCOUNTER — Ambulatory Visit (INDEPENDENT_AMBULATORY_CARE_PROVIDER_SITE_OTHER): Payer: Medicare Other | Admitting: Rheumatology

## 2021-02-13 ENCOUNTER — Encounter: Payer: Self-pay | Admitting: Rheumatology

## 2021-02-13 VITALS — BP 112/67 | HR 61 | Resp 16 | Ht 65.0 in | Wt 195.0 lb

## 2021-02-13 DIAGNOSIS — M79641 Pain in right hand: Secondary | ICD-10-CM | POA: Diagnosis not present

## 2021-02-13 DIAGNOSIS — M0579 Rheumatoid arthritis with rheumatoid factor of multiple sites without organ or systems involvement: Secondary | ICD-10-CM

## 2021-02-13 DIAGNOSIS — M79642 Pain in left hand: Secondary | ICD-10-CM

## 2021-02-13 DIAGNOSIS — Z87891 Personal history of nicotine dependence: Secondary | ICD-10-CM

## 2021-02-13 DIAGNOSIS — Z8709 Personal history of other diseases of the respiratory system: Secondary | ICD-10-CM

## 2021-02-13 DIAGNOSIS — M79671 Pain in right foot: Secondary | ICD-10-CM

## 2021-02-13 DIAGNOSIS — Z79899 Other long term (current) drug therapy: Secondary | ICD-10-CM | POA: Diagnosis not present

## 2021-02-13 DIAGNOSIS — M79672 Pain in left foot: Secondary | ICD-10-CM

## 2021-02-13 DIAGNOSIS — R7303 Prediabetes: Secondary | ICD-10-CM

## 2021-02-13 DIAGNOSIS — Z13820 Encounter for screening for osteoporosis: Secondary | ICD-10-CM

## 2021-02-13 DIAGNOSIS — Z780 Asymptomatic menopausal state: Secondary | ICD-10-CM

## 2021-02-13 NOTE — Patient Instructions (Addendum)
Standing Labs We placed an order today for your standing lab work.   Please have your standing labs drawn in May and every 3 months  If possible, please have your labs drawn 2 weeks prior to your appointment so that the provider can discuss your results at your appointment.  We have open lab daily Monday through Thursday from 1:30-4:30 PM and Friday from 1:30-4:00 PM at the office of Dr. Pollyann Savoy, [facility].   Please be advised, all patients with office appointments requiring lab work will take precedents over walk-in lab work.  If possible, please come for your lab work on Monday and Friday afternoons, as you may experience shorter wait times. The office is located at [address], [address], [place], [place] [address] No appointment is necessary.   Labs are drawn by [facility]. Please bring your co-pay at the time of your lab draw.  You may receive a bill from [facility] for your lab work.  If you wish to have your labs drawn at another location, please call the office 24 hours in advance to send orders.  If you have any questions regarding directions or hours of operation,  please call [phone].   As a reminder, please drink plenty of water prior to coming for your lab work. Thanks!  Heart Disease Prevention   Your inflammatory disease increases your risk of heart disease which includes heart attack, stroke, atrial fibrillation (irregular heartbeats), high blood pressure, heart failure and atherosclerosis (plaque in the arteries).  It is important to reduce your risk by:    Keep blood pressure, cholesterol, and blood sugar at healthy levels    Smoking Cessation    Maintain a healthy weight  o BMI 20-25    Eat a healthy diet  o Plenty of fresh fruit, vegetables, and whole grains  o Limit saturated fats, foods high in sodium, and added sugars  o DASH and Mediterranean diet    Increase physical activity  o Recommend moderate physically activity for 150  minutes per week/ 30 minutes a day for five days a week These can be broken up into three separate ten-minute sessions during the day.    Reduce Stress   Meditation, slow breathing exercises, yoga, coloring books   Dental visits twice a year

## 2021-02-28 ENCOUNTER — Other Ambulatory Visit: Payer: Self-pay | Admitting: Medical-Surgical

## 2021-02-28 DIAGNOSIS — M069 Rheumatoid arthritis, unspecified: Secondary | ICD-10-CM

## 2021-03-18 ENCOUNTER — Other Ambulatory Visit: Payer: Self-pay

## 2021-04-01 NOTE — Progress Notes (Deleted)
Office Visit Note  Patient: Lori Banks             Date of Birth: Aug 02, 1961           MRN: [id number]             PCP: Christen Butter, NP Referring: Christen Butter, NP Visit Date: 04/15/2021 Occupation: @GUAROCC @  Subjective:    History of Present Illness: Iyanna Drummer is a 60 y.o. female with history of seropositive rheumatoid arthritis.  Patient is currently taking Xeljanz 10 mg 1/2 tablet BID and methotrexate 3 tablets twice daily one day per week.   TB Gold negative on 01/22/2021.  Lipid panel updated on 01/22/2021.  CBC and CMP are due today.  Orders for CBC and CMP were released.  Activities of Daily Living:  Patient reports morning stiffness for *** {minute/hour:19697}.   Patient {ACTIONS;DENIES/REPORTS:21021675::"Denies"} nocturnal pain.  Difficulty dressing/grooming: {ACTIONS;DENIES/REPORTS:21021675::"Denies"} Difficulty climbing stairs: {ACTIONS;DENIES/REPORTS:21021675::"Denies"} Difficulty getting out of chair: {ACTIONS;DENIES/REPORTS:21021675::"Denies"} Difficulty using hands for taps, buttons, cutlery, and/or writing: {ACTIONS;DENIES/REPORTS:21021675::"Denies"}  No Rheumatology ROS completed.   PMFS History:  Patient Active Problem List   Diagnosis Date Noted  . Rheumatoid arthritis involving multiple sites (HCC) 12/24/2020  . Prediabetes 12/24/2020    Past Medical History:  Diagnosis Date  . COPD (chronic obstructive pulmonary disease) (HCC)   . Rheumatoid arthritis (HCC)     Family History  Problem Relation Age of Onset  . Diabetes Mother   . Osteoarthritis Mother   . Heart disease Father   . Diabetes Sister   . Fibromyalgia Sister   . Meniere's disease Brother   . Healthy Son   . Healthy Son   . Seizures Son   . Healthy Daughter   . Epilepsy Nephew    Past Surgical History:  Procedure Laterality Date  . APPENDECTOMY    . CESAREAN SECTION    . MULTIPLE TOOTH EXTRACTIONS     all teeth have been removed, patient wears dentures   .  TUBAL LIGATION     Social History   Social History Narrative  . Not on file   Immunization History  Administered Date(s) Administered  . Influenza,inj,Quad PF,6+ Mos 12/24/2020  . Moderna Sars-Covid-2 Vaccination 03/20/2020, 04/18/2020, 07/05/2020     Objective: Vital Signs: There were no vitals taken for this visit.   Physical Exam   Musculoskeletal Exam: ***  CDAI Exam: CDAI Score: -- Patient Global: --; Provider Global: -- Swollen: --; Tender: -- Joint Exam 04/15/2021   No joint exam has been documented for this visit   There is currently no information documented on the homunculus. Go to the Rheumatology activity and complete the homunculus joint exam.  Investigation: No additional findings.  Imaging: No results found.  Recent Labs: Lab Results  Component Value Date   WBC 5.6 01/22/2021   HGB 13.4 01/22/2021   PLT 230 01/22/2021   NA 141 01/22/2021   K 4.4 01/22/2021   CL 106 01/22/2021   CO2 24 01/22/2021   GLUCOSE 88 01/22/2021   BUN 21 01/22/2021   CREATININE 0.81 01/22/2021   BILITOT 0.5 01/22/2021   AST 17 01/22/2021   ALT 19 01/22/2021   PROT 6.5 01/22/2021   PROT 6.7 01/22/2021   CALCIUM 10.2 01/22/2021   GFRAA 91 01/22/2021   QFTBGOLDPLUS NEGATIVE 01/22/2021    Speciality Comments: MTX+PLQ+Prednisonex 66yrs.,Remicade +MTX x 40yrs, Orenciax1 only, Xeljanz + MTX 6tabs po qwk since2018.  Procedures:  No procedures performed Allergies: Patient has no known allergies.  Assessment / Plan:     Visit Diagnoses: No diagnosis found.  Orders: No orders of the defined types were placed in this encounter.  No orders of the defined types were placed in this encounter.   Face-to-face time spent with patient was *** minutes. Greater than 50% of time was spent in counseling and coordination of care.  Follow-Up Instructions: No follow-ups on file.   Ellen Henri, CMA  Note - This record has been created using Animal nutritionist.  Chart  creation errors have been sought, but may not always  have been located. Such creation errors do not reflect on  the standard of medical care.

## 2021-04-15 ENCOUNTER — Other Ambulatory Visit: Payer: Self-pay

## 2021-04-15 ENCOUNTER — Ambulatory Visit: Payer: Medicare Other | Admitting: Physician Assistant

## 2021-04-15 DIAGNOSIS — M79641 Pain in right hand: Secondary | ICD-10-CM

## 2021-04-15 DIAGNOSIS — R7303 Prediabetes: Secondary | ICD-10-CM

## 2021-04-15 DIAGNOSIS — Z87891 Personal history of nicotine dependence: Secondary | ICD-10-CM

## 2021-04-15 DIAGNOSIS — Z8709 Personal history of other diseases of the respiratory system: Secondary | ICD-10-CM

## 2021-04-15 DIAGNOSIS — Z13820 Encounter for screening for osteoporosis: Secondary | ICD-10-CM

## 2021-04-15 DIAGNOSIS — M79672 Pain in left foot: Secondary | ICD-10-CM

## 2021-04-15 DIAGNOSIS — M0579 Rheumatoid arthritis with rheumatoid factor of multiple sites without organ or systems involvement: Secondary | ICD-10-CM

## 2021-04-15 DIAGNOSIS — Z79899 Other long term (current) drug therapy: Secondary | ICD-10-CM

## 2021-05-08 ENCOUNTER — Other Ambulatory Visit: Payer: Self-pay | Admitting: Medical-Surgical

## 2021-05-08 DIAGNOSIS — M069 Rheumatoid arthritis, unspecified: Secondary | ICD-10-CM

## 2021-10-02 ENCOUNTER — Telehealth: Payer: Self-pay | Admitting: Medical-Surgical

## 2021-10-02 NOTE — Telephone Encounter (Signed)
Lori Banks    I called patient to schedule her Medicare Wellness and she states she got new insurance and has a new PCP I am removing you as the PCP so you will not get dingedon any of her Health Maintenance wanted to give you a heads up.   Arline Asp

## 2021-11-28 ENCOUNTER — Other Ambulatory Visit: Payer: Self-pay | Admitting: Rheumatology

## 2021-11-28 DIAGNOSIS — M0579 Rheumatoid arthritis with rheumatoid factor of multiple sites without organ or systems involvement: Secondary | ICD-10-CM

## 2022-02-27 ENCOUNTER — Emergency Department: Payer: Medicare (Managed Care)

## 2022-02-27 ENCOUNTER — Other Ambulatory Visit: Payer: Self-pay

## 2022-02-27 ENCOUNTER — Encounter: Payer: Self-pay | Admitting: Emergency Medicine

## 2022-02-27 ENCOUNTER — Emergency Department
Admission: EM | Admit: 2022-02-27 | Discharge: 2022-02-27 | Disposition: A | Discharge status: Home / Self Care | Payer: Medicare (Managed Care) | Attending: Emergency Medicine | Admitting: Emergency Medicine

## 2022-02-27 DIAGNOSIS — S82831A Other fracture of upper and lower end of right fibula, initial encounter for closed fracture: Secondary | ICD-10-CM | POA: Diagnosis not present

## 2022-02-27 DIAGNOSIS — W010XXA Fall on same level from slipping, tripping and stumbling without subsequent striking against object, initial encounter: Secondary | ICD-10-CM | POA: Insufficient documentation

## 2022-02-27 DIAGNOSIS — S82891A Other fracture of right lower leg, initial encounter for closed fracture: Secondary | ICD-10-CM

## 2022-02-27 DIAGNOSIS — S8251XA Displaced fracture of medial malleolus of right tibia, initial encounter for closed fracture: Secondary | ICD-10-CM | POA: Insufficient documentation

## 2022-02-27 DIAGNOSIS — S99911A Unspecified injury of right ankle, initial encounter: Secondary | ICD-10-CM | POA: Diagnosis present

## 2022-02-27 DIAGNOSIS — M25571 Pain in right ankle and joints of right foot: Secondary | ICD-10-CM | POA: Insufficient documentation

## 2022-02-27 DIAGNOSIS — Y92511 Restaurant or cafe as the place of occurrence of the external cause: Secondary | ICD-10-CM | POA: Insufficient documentation

## 2022-02-27 MED ORDER — OXYCODONE HCL 5 MG PO TABS
5.0000 mg | ORAL_TABLET | ORAL | Status: AC
Start: 2022-02-27 — End: 2022-02-27
  Administered 2022-02-27: 5 mg via ORAL
  Filled 2022-02-27: qty 1

## 2022-02-27 MED ORDER — SODIUM CHLORIDE 0.9 % IV BOLUS
1000.0000 mL | Freq: Once | INTRAVENOUS | Status: AC
  Administered 2022-02-27: 1000 mL via INTRAVENOUS

## 2022-02-27 MED ORDER — OXYCODONE HCL 5 MG PO TABS: 5.0000 mg | ORAL_TABLET | Freq: Four times a day (QID) | ORAL | 0 refills | Status: DC | PRN

## 2022-02-27 NOTE — ED Provider Notes (Signed)
? ?[facility] ?Provider Note ? ? ? Event Date/Time  ? First MD Initiated Contact with Patient 02/27/22 2044   ?  (approximate) ? ? ?History  ? ?Fall and Hypotension ? ? ?HPI ? ?Lori Banks is a 61 y.o. female with a history of rheumatoid arthritis who reports being in her usual state of health this evening until having a slip and fall in a restaurant bathroom.  Denies head injury.  Only location of pain is in the right ankle which was noted to have deformity by EMS and splinted prior to arrival in the ED.  Patient denies any other complaint such as chest pain shortness of breath headache or neck pain.  No paresthesias or motor weakness. ? ?She had just finished eating dinner just prior to this incident. ?  ? ? ?Physical Exam  ? ?Triage Vital Signs: ?ED Triage Vitals  ?Enc Vitals Group  ?   BP 02/27/22 2042 (!) 89/77  ?   Pulse Rate 02/27/22 2042 69  ?   Resp 02/27/22 2042 19  ?   Temp 02/27/22 2042 97.7 ?F (36.5 ?C)  ?   Temp Source 02/27/22 2042 Oral  ?   SpO2 02/27/22 2042 100 %  ?   Weight 02/27/22 2044 190 lb (86.2 kg)  ?   Height 02/27/22 2044 5\' 6"  (1.676 m)  ?   Head Circumference --   ?   Peak Flow --   ?   Pain Score 02/27/22 2044 8  ?   Pain Loc --   ?   Pain Edu? --   ?   Excl. in GC? --   ? ? ?Most recent vital signs: ?Vitals:  ? 02/27/22 2215 02/27/22 2245  ?BP: (!) 124/55 129/66  ?Pulse: 65 79  ?Resp: 14 16  ?Temp:    ?SpO2: 99% 97%  ? ? ? ?General: Awake, no distress.  ?CV:  Good peripheral perfusion.  Normal dorsalis pedis pulse and capillary refill. ?Resp:  Normal effort.  ?Abd:  No distention.  ?Other:  There is tenderness and palpable fracture at the right distal fibula and right lateral malleolus.  No leg shortening, no open wounds or skin tenting, no evidence of dislocation.  Proximal fibula is nontender, no other apparent injuries. ? ? ?ED Results / Procedures / Treatments  ? ?Labs ?(all labs ordered are listed, but only abnormal results are displayed) ?Labs  Reviewed - No data to display ? ? ?EKG ? ? ? ? ?RADIOLOGY ?X-ray right ankle and right tibia-fibula viewed and interpreted by me, shows nondisplaced fracture of the right lateral malleolus and a spiral fracture of the right distal fibula.  Radiology report reviewed. ? ? ? ?PROCEDURES: ? ?Critical Care performed: No ? ?.Ortho Injury Treatment ? ?Date/Time: 02/27/2022 11:15 PM ?Performed by: Sharman Cheek, MD ?Authorized by: Sharman Cheek, MD  ? ?Consent:  ?  Consent obtained:  Verbal ?  Consent given by:  Patient ?  Risks discussed:  Fracture, stiffness, vascular damage, nerve damage and restricted joint movement ?  Alternatives discussed:  ImmobilizationInjury location: ankle ?Location details: right ankle ?Injury type: fracture ?Fracture type: bimalleolar ?Pre-procedure neurovascular assessment: neurovascularly intact ?Pre-procedure distal perfusion: normal ?Pre-procedure neurological function: normal ?Pre-procedure range of motion: reduced ? ?Anesthesia: ?Local anesthesia used: no ? ?Patient sedated: NoManipulation performed: no ?Immobilization: splint ?Splint type: short leg and ankle stirrup ?Splint Applied by: ED Nurse and ED Provider ?Supplies used: cotton padding, elastic bandage and Ortho-Glass ?Post-procedure neurovascular assessment: post-procedure neurovascularly intact ?  Post-procedure distal perfusion: normal ?Post-procedure neurological function: normal ?Post-procedure range of motion: unchanged ? ? ? ? ?MEDICATIONS ORDERED IN ED: ?Medications  ?sodium chloride 0.9 % bolus 1,000 mL (0 mLs Intravenous Stopped 02/27/22 2307)  ? ? ? ?IMPRESSION / MDM / ASSESSMENT AND PLAN / ED COURSE  ?I reviewed the triage vital signs and the nursing notes. ?             ?               ? ? ? ?Patient presents after mechanical fall resulting in bimalleolar ankle fracture.  Not significantly displaced on imaging.  Neurovascularly intact.  Very recent food intake increasing aspiration risk of sedation.  Since the  injury did not require manipulation, this was splinted in place without sedation, remained neurovascular intact afterward.  Patient was provided crutches, prescription for pain medicine, follow-up with orthopedics.  Counseled on return precautions including warning signs for compartment syndrome. ? ? ?  ? ? ?FINAL CLINICAL IMPRESSION(S) / ED DIAGNOSES  ? ?Final diagnoses:  ?Ankle fracture, right, closed, initial encounter  ? ? ? ?Rx / DC Orders  ? ?ED Discharge Orders   ? ?      Ordered  ?  oxyCODONE (ROXICODONE) 5 MG immediate release tablet  Every 6 hours PRN       ? 02/27/22 2313  ? ?  ?  ? ?  ? ? ? ?Note:  This document was prepared using Dragon voice recognition software and may include unintentional dictation errors. ?  ?Sharman CheekStafford, Valli Randol, MD ?02/27/22 2320 ? ?

## 2022-02-27 NOTE — Discharge Instructions (Addendum)
Take 0.5 to 1 tablet of oxycodone as needed for severe pain.  ? ?Avoid bearing any weight on your right leg until you follow up with orthopedics. ? ?Be sure to keep the splint dry at all times. ? ?Apply ice over the ankle off and on for the next 48 hours. ?

## 2022-02-27 NOTE — ED Triage Notes (Signed)
Pt to ED via [facility] from [facility] after pt slipped and fell in bathroom. After falling pt felt diaphoretic, "cold", and pale. EMS states she had no radial pulse upon arrival. Denies LOC and denies hitting head.  ?Per EMS pt blood pressure of 80/50; 326ml NS given in route.  ?Pt c/o R Ankle pain after fall; states its feeling numb.  ? ?Pt is A&ox4. Calm and cooperative.  ?

## 2022-02-28 ENCOUNTER — Telehealth: Payer: Self-pay | Admitting: Emergency Medicine

## 2022-02-28 MED ORDER — OXYCODONE HCL 5 MG PO TABS: 5.0000 mg | ORAL_TABLET | Freq: Three times a day (TID) | ORAL | 0 refills | Status: DC | PRN

## 2022-02-28 NOTE — Telephone Encounter (Signed)
Pharmacy closed on weekend ?

## 2022-03-06 ENCOUNTER — Other Ambulatory Visit: Payer: Self-pay | Admitting: Orthopedic Surgery

## 2022-03-09 ENCOUNTER — Encounter
Admission: RE | Disposition: A | Discharge status: Home / Self Care | Payer: Self-pay | Source: Home / Self Care | Attending: Orthopedic Surgery

## 2022-03-09 ENCOUNTER — Ambulatory Visit: Payer: Medicare (Managed Care)

## 2022-03-09 ENCOUNTER — Other Ambulatory Visit: Payer: Self-pay

## 2022-03-09 ENCOUNTER — Ambulatory Visit: Payer: Medicare (Managed Care) | Admitting: Anesthesiology

## 2022-03-09 ENCOUNTER — Encounter: Payer: Self-pay | Admitting: Orthopedic Surgery

## 2022-03-09 ENCOUNTER — Ambulatory Visit
Admission: RE | Admit: 2022-03-09 | Discharge: 2022-03-09 | Disposition: A | Discharge status: Home / Self Care | Payer: Medicare (Managed Care) | Attending: Orthopedic Surgery | Admitting: Orthopedic Surgery

## 2022-03-09 DIAGNOSIS — S82841A Displaced bimalleolar fracture of right lower leg, initial encounter for closed fracture: Secondary | ICD-10-CM | POA: Diagnosis not present

## 2022-03-09 DIAGNOSIS — W19XXXA Unspecified fall, initial encounter: Secondary | ICD-10-CM | POA: Insufficient documentation

## 2022-03-09 DIAGNOSIS — J449 Chronic obstructive pulmonary disease, unspecified: Secondary | ICD-10-CM | POA: Insufficient documentation

## 2022-03-09 DIAGNOSIS — Z87891 Personal history of nicotine dependence: Secondary | ICD-10-CM | POA: Diagnosis not present

## 2022-03-09 HISTORY — PX: ORIF ANKLE FRACTURE: SHX5408

## 2022-03-09 SURGERY — OPEN REDUCTION INTERNAL FIXATION (ORIF) ANKLE FRACTURE
Anesthesia: Regional | Site: Ankle | Laterality: Right

## 2022-03-09 MED ORDER — KETOROLAC TROMETHAMINE 15 MG/ML IJ SOLN
7.5000 mg | Freq: Four times a day (QID) | INTRAMUSCULAR | Status: DC
Start: 2022-03-09 — End: 2022-03-09

## 2022-03-09 MED ORDER — PHENYLEPHRINE HCL-NACL 20-0.9 MG/250ML-% IV SOLN
INTRAVENOUS | Status: DC | PRN
  Administered 2022-03-09: 35 ug/min via INTRAVENOUS

## 2022-03-09 MED ORDER — LIDOCAINE HCL (CARDIAC) PF 100 MG/5ML IV SOSY
PREFILLED_SYRINGE | INTRAVENOUS | Status: DC | PRN
Start: 2022-03-09 — End: 2022-03-09
  Administered 2022-03-09: 100 mg via INTRAVENOUS

## 2022-03-09 MED ORDER — KETOROLAC TROMETHAMINE 15 MG/ML IJ SOLN
INTRAMUSCULAR | Status: AC
  Filled 2022-03-09: qty 1

## 2022-03-09 MED ORDER — KETAMINE HCL 50 MG/5ML IJ SOSY
PREFILLED_SYRINGE | INTRAMUSCULAR | Status: AC
  Filled 2022-03-09: qty 5

## 2022-03-09 MED ORDER — ONDANSETRON HCL 4 MG/2ML IJ SOLN
INTRAMUSCULAR | Status: AC
  Filled 2022-03-09: qty 2

## 2022-03-09 MED ORDER — EPHEDRINE SULFATE (PRESSORS) 50 MG/ML IJ SOLN
INTRAMUSCULAR | Status: DC | PRN
  Administered 2022-03-09: 10 mg via INTRAVENOUS

## 2022-03-09 MED ORDER — HYDROMORPHONE HCL 1 MG/ML IJ SOLN
INTRAMUSCULAR | Status: AC
  Administered 2022-03-09: 0.5 mg via INTRAVENOUS
  Filled 2022-03-09: qty 1

## 2022-03-09 MED ORDER — PROPOFOL 10 MG/ML IV BOLUS
INTRAVENOUS | Status: DC | PRN
  Administered 2022-03-09: 130 mg via INTRAVENOUS

## 2022-03-09 MED ORDER — FENTANYL CITRATE (PF) 100 MCG/2ML IJ SOLN
INTRAMUSCULAR | Status: DC | PRN
  Administered 2022-03-09 (×4): 25 ug via INTRAVENOUS

## 2022-03-09 MED ORDER — PRONTOSAN WOUND IRRIGATION OPTIME
TOPICAL | Status: DC | PRN
  Administered 2022-03-09: 1 via TOPICAL

## 2022-03-09 MED ORDER — KETAMINE HCL 10 MG/ML IJ SOLN
INTRAMUSCULAR | Status: DC | PRN
  Administered 2022-03-09: 20 mg via INTRAVENOUS

## 2022-03-09 MED ORDER — ROPIVACAINE HCL 5 MG/ML IJ SOLN
INTRAMUSCULAR | Status: DC | PRN
  Administered 2022-03-09 (×2): 5 mL via PERINEURAL

## 2022-03-09 MED ORDER — PROPOFOL 10 MG/ML IV BOLUS
INTRAVENOUS | Status: AC
  Filled 2022-03-09: qty 20

## 2022-03-09 MED ORDER — HYDROMORPHONE HCL 1 MG/ML IJ SOLN
INTRAMUSCULAR | Status: AC
  Filled 2022-03-09: qty 1

## 2022-03-09 MED ORDER — DEXAMETHASONE SODIUM PHOSPHATE 10 MG/ML IJ SOLN
INTRAMUSCULAR | Status: DC | PRN
  Administered 2022-03-09: 10 mg via INTRAVENOUS

## 2022-03-09 MED ORDER — CEFAZOLIN SODIUM-DEXTROSE 2-4 GM/100ML-% IV SOLN
2.0000 g | INTRAVENOUS | Status: AC
Start: 2022-03-09 — End: 2022-03-09
  Administered 2022-03-09: 2 g via INTRAVENOUS

## 2022-03-09 MED ORDER — CHLORHEXIDINE GLUCONATE 0.12 % MT SOLN
OROMUCOSAL | Status: AC
  Administered 2022-03-09: 15 mL
  Filled 2022-03-09: qty 15

## 2022-03-09 MED ORDER — FENTANYL CITRATE (PF) 100 MCG/2ML IJ SOLN
25.0000 ug | INTRAMUSCULAR | Status: DC | PRN
  Administered 2022-03-09 (×3): 25 ug via INTRAVENOUS

## 2022-03-09 MED ORDER — 0.9 % SODIUM CHLORIDE (POUR BTL) OPTIME
TOPICAL | Status: DC | PRN
  Administered 2022-03-09: 500 mL
  Administered 2022-03-09: 1000 mL

## 2022-03-09 MED ORDER — LIDOCAINE HCL (PF) 1 % IJ SOLN
INTRAMUSCULAR | Status: AC
  Filled 2022-03-09: qty 5

## 2022-03-09 MED ORDER — ONDANSETRON HCL 4 MG/2ML IJ SOLN
INTRAMUSCULAR | Status: DC | PRN
  Administered 2022-03-09: 4 mg via INTRAVENOUS

## 2022-03-09 MED ORDER — ACETAMINOPHEN 10 MG/ML IV SOLN
INTRAVENOUS | Status: AC
Start: 2022-03-09 — End: ?
  Filled 2022-03-09: qty 100

## 2022-03-09 MED ORDER — OXYCODONE-ACETAMINOPHEN 5-325 MG PO TABS: 1.0000 | ORAL_TABLET | ORAL | 0 refills | Status: DC | PRN

## 2022-03-09 MED ORDER — HYDROMORPHONE HCL 1 MG/ML IJ SOLN
0.5000 mg | INTRAMUSCULAR | Status: DC | PRN
  Administered 2022-03-09: 0.5 mg via INTRAVENOUS

## 2022-03-09 MED ORDER — FENTANYL CITRATE (PF) 100 MCG/2ML IJ SOLN
INTRAMUSCULAR | Status: AC
  Administered 2022-03-09: 25 ug via INTRAVENOUS
  Filled 2022-03-09: qty 2

## 2022-03-09 MED ORDER — LIDOCAINE HCL (PF) 2 % IJ SOLN
INTRAMUSCULAR | Status: AC
  Filled 2022-03-09: qty 5

## 2022-03-09 MED ORDER — MIDAZOLAM HCL 2 MG/2ML IJ SOLN
INTRAMUSCULAR | Status: DC | PRN
Start: 2022-03-09 — End: 2022-03-09
  Administered 2022-03-09: 2 mg via INTRAVENOUS

## 2022-03-09 MED ORDER — DEXMEDETOMIDINE (PRECEDEX) IN NS 20 MCG/5ML (4 MCG/ML) IV SYRINGE
PREFILLED_SYRINGE | INTRAVENOUS | Status: DC | PRN
  Administered 2022-03-09: 8 ug via INTRAVENOUS
  Administered 2022-03-09: 4 ug via INTRAVENOUS
  Administered 2022-03-09: 8 ug via INTRAVENOUS

## 2022-03-09 MED ORDER — MIDAZOLAM HCL 2 MG/2ML IJ SOLN
INTRAMUSCULAR | Status: AC
  Filled 2022-03-09: qty 2

## 2022-03-09 MED ORDER — LIDOCAINE HCL (PF) 1 % IJ SOLN
INTRAMUSCULAR | Status: DC | PRN
  Administered 2022-03-09: 2 mL via SUBCUTANEOUS

## 2022-03-09 MED ORDER — OXYCODONE HCL 5 MG PO TABS
ORAL_TABLET | ORAL | Status: AC
  Filled 2022-03-09: qty 1

## 2022-03-09 MED ORDER — KETOROLAC TROMETHAMINE 15 MG/ML IJ SOLN
7.5000 mg | Freq: Four times a day (QID) | INTRAMUSCULAR | Status: DC
  Administered 2022-03-09: 7.5 mg via INTRAVENOUS

## 2022-03-09 MED ORDER — DOCUSATE SODIUM 100 MG PO CAPS: 100.0000 mg | ORAL_CAPSULE | Freq: Every day | ORAL | 2 refills | Status: DC | PRN

## 2022-03-09 MED ORDER — ACETAMINOPHEN 10 MG/ML IV SOLN
INTRAVENOUS | Status: DC | PRN
Start: 2022-03-09 — End: 2022-03-09
  Administered 2022-03-09: 1000 mg via INTRAVENOUS

## 2022-03-09 MED ORDER — CEFAZOLIN SODIUM-DEXTROSE 2-4 GM/100ML-% IV SOLN
INTRAVENOUS | Status: AC
  Filled 2022-03-09: qty 100

## 2022-03-09 MED ORDER — ONDANSETRON HCL 4 MG/2ML IJ SOLN
4.0000 mg | Freq: Once | INTRAMUSCULAR | Status: AC | PRN
  Administered 2022-03-09: 4 mg via INTRAVENOUS

## 2022-03-09 MED ORDER — LACTATED RINGERS IV SOLN: INTRAVENOUS | Status: DC

## 2022-03-09 MED ORDER — BUPIVACAINE HCL (PF) 0.5 % IJ SOLN
INTRAMUSCULAR | Status: AC
  Filled 2022-03-09: qty 10

## 2022-03-09 MED ORDER — OXYCODONE HCL 5 MG PO TABS
5.0000 mg | ORAL_TABLET | Freq: Once | ORAL | Status: AC
  Administered 2022-03-09: 5 mg via ORAL

## 2022-03-09 MED ORDER — DEXAMETHASONE SODIUM PHOSPHATE 10 MG/ML IJ SOLN
INTRAMUSCULAR | Status: AC
  Filled 2022-03-09: qty 1

## 2022-03-09 MED ORDER — HYDROMORPHONE HCL 1 MG/ML IJ SOLN
INTRAMUSCULAR | Status: DC | PRN
  Administered 2022-03-09 (×2): .5 mg via INTRAVENOUS

## 2022-03-09 MED ORDER — BUPIVACAINE HCL (PF) 0.5 % IJ SOLN
INTRAMUSCULAR | Status: DC | PRN
  Administered 2022-03-09: 20 mL via PERINEURAL
  Administered 2022-03-09: 10 mL via PERINEURAL

## 2022-03-09 MED ORDER — FENTANYL CITRATE (PF) 100 MCG/2ML IJ SOLN
INTRAMUSCULAR | Status: AC
  Filled 2022-03-09: qty 2

## 2022-03-09 MED ORDER — PHENYLEPHRINE 40 MCG/ML (10ML) SYRINGE FOR IV PUSH (FOR BLOOD PRESSURE SUPPORT)
PREFILLED_SYRINGE | INTRAVENOUS | Status: DC | PRN
  Administered 2022-03-09: 80 ug via INTRAVENOUS

## 2022-03-09 SURGICAL SUPPLY — 63 items
APL PRP STRL LF DISP 70% ISPRP (MISCELLANEOUS) ×2
BIT DRILL CANN 2.7X625 NONSTRL (BIT) ×1 IMPLANT
BIT DRILL QC 2X140 (BIT) ×1 IMPLANT
BNDG COHESIVE 6X5 TAN ST LF (GAUZE/BANDAGES/DRESSINGS) ×2 IMPLANT
BNDG ELASTIC 6X5.8 VLCR STR LF (GAUZE/BANDAGES/DRESSINGS) ×2 IMPLANT
BNDG ESMARK 6X12 TAN STRL LF (GAUZE/BANDAGES/DRESSINGS) ×2 IMPLANT
BRUSH SCRUB EZ  4% CHG (MISCELLANEOUS) ×2
BRUSH SCRUB EZ 4% CHG (MISCELLANEOUS) ×2 IMPLANT
CHLORAPREP W/TINT 26 (MISCELLANEOUS) ×4 IMPLANT
CUFF TOURN SGL QUICK 24 (TOURNIQUET CUFF)
CUFF TOURN SGL QUICK 34 (TOURNIQUET CUFF)
CUFF TRNQT CYL 24X4X16.5-23 (TOURNIQUET CUFF) IMPLANT
CUFF TRNQT CYL 34X4.125X (TOURNIQUET CUFF) IMPLANT
DRAPE 3/4 80X56 (DRAPES) ×2 IMPLANT
DRAPE FLUOR MINI C-ARM 54X84 (DRAPES) ×2 IMPLANT
ELECT REM PT RETURN 9FT ADLT (ELECTROSURGICAL) ×2
ELECTRODE REM PT RTRN 9FT ADLT (ELECTROSURGICAL) ×1 IMPLANT
GAUZE XEROFORM 1X8 LF (GAUZE/BANDAGES/DRESSINGS) ×4 IMPLANT
GLOVE SURG ORTHO LTX SZ8.5 (GLOVE) ×6 IMPLANT
GLOVE SURG UNDER POLY LF SZ8.5 (GLOVE) ×6 IMPLANT
GOWN STRL REUS W/ TWL LRG LVL3 (GOWN DISPOSABLE) ×1 IMPLANT
GOWN STRL REUS W/ TWL XL LVL3 (GOWN DISPOSABLE) ×1 IMPLANT
GOWN STRL REUS W/TWL LRG LVL3 (GOWN DISPOSABLE) ×2
GOWN STRL REUS W/TWL XL LVL3 (GOWN DISPOSABLE) ×2
GUIDEWARE NON THREAD 1.25X150 (WIRE) ×5
GUIDEWIRE NON THREAD 1.25X150 (WIRE) IMPLANT
K-WIRE 1.6X150 (WIRE) ×2
K-WIRE 2.0X150M (WIRE) ×2
KIT TURNOVER KIT A (KITS) ×2 IMPLANT
KWIRE 1.6X150 (WIRE) IMPLANT
KWIRE 2.0X150M (WIRE) IMPLANT
MANIFOLD NEPTUNE II (INSTRUMENTS) ×2 IMPLANT
NS IRRIG 1000ML POUR BTL (IV SOLUTION) ×2 IMPLANT
PACK EXTREMITY ARMC (MISCELLANEOUS) ×2 IMPLANT
PAD ABD DERMACEA PRESS 5X9 (GAUZE/BANDAGES/DRESSINGS) ×8 IMPLANT
PAD CAST CTTN 4X4 STRL (SOFTGOODS) ×2 IMPLANT
PADDING CAST COTTON 4X4 STRL (SOFTGOODS) ×4
PLATE DIST FIB 131MM 7H (Plate) ×1 IMPLANT
SCALPEL PROTECTED #10 DISP (BLADE) ×2 IMPLANT
SCALPEL PROTECTED #15 DISP (BLADE) ×2 IMPLANT
SCREW CANN S THRD/44 4.0 (Screw) ×3 IMPLANT
SCREW CORTEX 2.7 SLF-TPNG 16MM (Screw) ×2 IMPLANT
SCREW CORTEX 2.7 SLF-TPNG 18MM (Screw) ×1 IMPLANT
SCREW CORTEX 3.5 16MM (Screw) ×1 IMPLANT
SCREW CORTEX 3.5 28MM (Screw) ×1 IMPLANT
SCREW CORTEX 3.5 50MM (Screw) ×1 IMPLANT
SCREW LOCK CORT ST 3.5X16 (Screw) IMPLANT
SCREW LOCK CORT ST 3.5X28 (Screw) IMPLANT
SCREW LOCK VA ST 2.7X18 (Screw) ×2 IMPLANT
SCREW LOCK VA ST 2.7X20 (Screw) ×1 IMPLANT
SCREW SELF TAP 14MM (Screw) ×2 IMPLANT
SOLUTION PRONTOSAN WOUND 350ML (IRRIGATION / IRRIGATOR) ×1 IMPLANT
SPLINT CAST 1 STEP 5X30 WHT (MISCELLANEOUS) ×2 IMPLANT
SPONGE T-LAP 18X18 ~~LOC~~+RFID (SPONGE) ×2 IMPLANT
STAPLER SKIN PROX 35W (STAPLE) ×2 IMPLANT
SUT VIC AB 2-0 CT2 27 (SUTURE) ×4 IMPLANT
SUT VIC AB 3-0 SH 27 (SUTURE) ×2
SUT VIC AB 3-0 SH 27X BRD (SUTURE) ×1 IMPLANT
TAPE SURG TRANSPORE 1 IN (GAUZE/BANDAGES/DRESSINGS) ×1 IMPLANT
TAPE SURGICAL TRANSPORE 1 IN (GAUZE/BANDAGES/DRESSINGS) ×1
TOWEL OR 17X26 4PK STRL BLUE (TOWEL DISPOSABLE) ×4 IMPLANT
WASHER 7MM DIA (Washer) ×1 IMPLANT
WATER STERILE IRR 500ML POUR (IV SOLUTION) ×2 IMPLANT

## 2022-03-09 NOTE — Op Note (Signed)
?  03/09/2022 ? ?2:13 PM ? ?PATIENT:  Lori Banks  61 y.o. female ? ?PRE-OPERATIVE DIAGNOSIS:  S82.841A Displaced bimalleolar fracture of right lower leg, init ? ?POST-OPERATIVE DIAGNOSIS:  Same ? ?PROCEDURE:  OPEN REDUCTION INTERNAL FIXATION (ORIF) ANKLE FRACTURE, RIGHT ? ?SURGEON:  Cassell Smiles, MD ? ?ASST:  Altamese Cabal, PA-C ? ?ANESTHESIA:   General ? ?EBL:  25 mL ? ?TOURNIQUET TIME:  60 min at 250 mmHg ? ?OPERATIVE FINDINGS:  unstable, comminuted, closed fracture of the lateral and medial malleolus ? ?OPERATIVE PROCEDURE:   The patient was brought to the operating room and placed in the supine position. All bony prominences were padded. General anesthesia was administered. The lower extremity was prepped and draped in the usual sterile fashion. The leg was elevated and exsanguinated and the tourniquet was inflated. Time out was performed.  ? ?Incision was made over the distal fibula and the fracture was exposed and reduced anatomically with a clamp. A lag screw was placed. I then applied a synthes lateral locking plate and secured it proximally with cortical screws and distally with locking screws.  I used the Fluoroscan to confirm satisfactory reduction and fixation. This wound was irrigated and closed with vicryl sutures and staples. ? ?I then turned my attention to the medial malleolus. Incision was made over the medial malleolus and the fracture exposed and held provisionally with a clamp. 3 guidepins were placed for the 4.0 mm cannulated screws and then confirmation of reduction was made with fluoroscopy. I then placed 3  44 mm screws which had satisfactory fixation. Fluoroscopy showed good reduction and hardware placement.  ? ?The syndesmosis was stressed using live fluoroscopy and found to be unstable. A single 3.5 mm screw 50 mm in length was placed across the syndesmosis. ? ?The medial wound was irrigated, and closed with vicryl and staples. Sponge and needle counts were correct. Sterile gauze  was applied followed by a posterior splint. She was awakened and returned to the PACU in stable and satisfactory condition. There were no apparent complications. ? ?Cassell Smiles, MD ? ?

## 2022-03-09 NOTE — Progress Notes (Signed)
Patient awake/alert x4, tolerated crackers and gingerale while in pacu. ?While in pacu Dr. Pernell Dupre:  anesthesia administered adductor canal block and popliteal block right lower ext.  Patient tolerated without event, vitals stable. Pain medications administered per orders, reviewed procedure and plan of care with patient, verbalizes understanding but states 'I am a wimp and it hurts"  reviewed/reinforced benefit of blocks administered by Dr. Pernell Dupre, pt verbalizes understanding.  ?

## 2022-03-09 NOTE — TOC Progression Note (Signed)
Transition of Care (TOC) - Progression Note  ? ? ?Patient Details  ?Name: Lori Banks [facility] ?MRN: [id number] ?Date of Birth: Nov 02, 1961 ? ?Transition of Care (TOC) CM/SW Contact  ?Marlowe Sax, RN ?Phone Number: ?03/09/2022, 2:27 PM ? ?Clinical Narrative:   Patient is se tup with [facility] for HH ? ? ? ?  ?  ? ?Expected Discharge Plan and Services ?  ?  ?  ?  ?  ?                ?  ?  ?  ?  ?  ?  ?  ?  ?  ?  ? ? ?Social Determinants of Health (SDOH) Interventions ?  ? ?Readmission Risk Interventions ?   ? View : No data to display.  ?  ?  ?  ? ? ?

## 2022-03-09 NOTE — Anesthesia Preprocedure Evaluation (Signed)
Anesthesia Evaluation  ?Patient identified by MRN, date of birth, ID band ?Patient awake ? ? ? ?Reviewed: ?Allergy & Precautions, H&P , NPO status , Patient's Chart, lab work & pertinent test results, reviewed documented beta blocker date and time  ? ?Airway ?Mallampati: II ? ?TM Distance: >3 FB ?Neck ROM: full ? ? ? Dental ? ?(+) Teeth Intact ?  ?Pulmonary ?COPD,  COPD inhaler, former smoker,  ?  ?Pulmonary exam normal ? ? ? ? ? ? ? Cardiovascular ?Exercise Tolerance: Good ?negative cardio ROS ?Normal cardiovascular exam ?Rate:Normal ? ?ekg noted. ja ?  ?Neuro/Psych ?negative neurological ROS ? negative psych ROS  ? GI/Hepatic ?negative GI ROS, Neg liver ROS,   ?Endo/Other  ?negative endocrine ROS ? Renal/GU ?negative Renal ROS  ?negative genitourinary ?  ?Musculoskeletal ? ? Abdominal ?  ?Peds ? Hematology ?negative hematology ROS ?(+)   ?Anesthesia Other Findings ? ? Reproductive/Obstetrics ?negative OB ROS ? ?  ? ? ? ? ? ? ? ? ? ? ? ? ? ?  ?  ? ? ? ? ? ? ? ? ?Anesthesia Physical ?Anesthesia Plan ? ?ASA: 3 ? ?Anesthesia Plan: General LMA  ? ?Post-op Pain Management:   ? ?Induction:  ? ?PONV Risk Score and Plan:  ? ?Airway Management Planned:  ? ?Additional Equipment:  ? ?Intra-op Plan:  ? ?Post-operative Plan:  ? ?Informed Consent: I have reviewed the patients History and Physical, chart, labs and discussed the procedure including the risks, benefits and alternatives for the proposed anesthesia with the patient or authorized representative who has indicated his/her understanding and acceptance.  ? ? ? ? ? ?Plan Discussed with: CRNA ? ?Anesthesia Plan Comments:   ? ? ? ? ? ? ?Anesthesia Quick Evaluation ? ?

## 2022-03-09 NOTE — H&P (Signed)
The patient has been re-examined, and the chart reviewed, and there have been no interval changes to the documented history and physical.  Plan a right ankle open reduction and internal fixation today.  Anesthesia is consulted regarding a peripheral nerve block for post-operative pain.  The risks, benefits, and alternatives have been discussed at length, and the patient is willing to proceed.     

## 2022-03-09 NOTE — Discharge Instructions (Addendum)
FOLLOW-UP APPOINTMENT ALREADY MADE ?STRICT NON-WEIGHT BEARING ON THE RIGHT LEG ?KEEP SPLINT CLEAN AND DRY ?ELEVATE LEG WHENEVER POSSIBLE ?CALL WITH QUESTIONS OR CONCERNS [phone] FEVER, SHORTNESS OF BREATH OR WOUND DRAINAGEAMBULATORY SURGERY  ?DISCHARGE INSTRUCTIONS ? ? ?The drugs that you were given will stay in your system until tomorrow so for the next 24 hours you should not: ? ?Drive an automobile ?Make any legal decisions ?Drink any alcoholic beverage ? ? ?You may resume regular meals tomorrow.  Today it is better to start with liquids and gradually work up to solid foods. ? ?You may eat anything you prefer, but it is better to start with liquids, then soup and crackers, and gradually work up to solid foods. ? ? ?Please notify your doctor immediately if you have any unusual bleeding, trouble breathing, redness and pain at the surgery site, drainage, fever, or pain not relieved by medication. ? ? ? ?Additional Instructions: ? ? ? ? ? ? ? ?Please contact your physician with any problems or Same Day Surgery at [phone], Monday through Friday 6 am to 4 pm, or [place] at [facility] number at [phone].  ?

## 2022-03-09 NOTE — Transfer of Care (Signed)
Immediate Anesthesia Transfer of Care Note ? ?Patient: Lori Banks ? ?Procedure(s) Performed: OPEN REDUCTION INTERNAL FIXATION (ORIF) ANKLE FRACTURE (Right: Ankle) ? ?Patient Location: PACU ? ?Anesthesia Type:General ? ?Level of Consciousness: awake ? ?Airway & Oxygen Therapy: Patient Spontanous Breathing and Patient connected to face mask oxygen ? ?Post-op Assessment: Report given to RN and Post -op Vital signs reviewed and stable ? ?Post vital signs: Reviewed and stable ? ?Last Vitals:  ?Vitals Value Taken Time  ?BP 164/79 03/09/22 1408  ?Temp    ?Pulse 58 03/09/22 1413  ?Resp 13 03/09/22 1413  ?SpO2 99 % 03/09/22 1413  ?Vitals shown include unvalidated device data. ? ?Last Pain:  ?Vitals:  ? 03/09/22 0929  ?TempSrc: Oral  ?PainSc: 0-No pain  ?   ? ?Patients Stated Pain Goal: 0 (03/09/22 0929) ? ?Complications: No notable events documented. ?

## 2022-03-09 NOTE — Anesthesia Procedure Notes (Signed)
Procedure Name: LMA Insertion ?Date/Time: 03/09/2022 12:20 PM ?Performed by: Loletha Grayer, CRNA ?Pre-anesthesia Checklist: Patient identified, Patient being monitored, Timeout performed, Emergency Drugs available and Suction available ?Patient Re-evaluated:Patient Re-evaluated prior to induction ?Oxygen Delivery Method: Circle system utilized ?Preoxygenation: Pre-oxygenation with 100% oxygen ?Induction Type: IV induction ?Ventilation: Mask ventilation without difficulty ?LMA: LMA inserted ?LMA Size: 4.0 ?Number of attempts: 1 ?Placement Confirmation: positive ETCO2 ?Tube secured with: Tape ?Dental Injury: Teeth and Oropharynx as per pre-operative assessment  ? ? ? ? ?

## 2022-03-09 NOTE — Anesthesia Procedure Notes (Signed)
Anesthesia Regional Block: Popliteal block  ? ?Pre-Anesthetic Checklist: , timeout performed,  Correct Patient, Correct Site, Correct Laterality,  Correct Procedure, Correct Position, site marked,  Risks and benefits discussed,  Surgical consent,  Pre-op evaluation,  At surgeon's request and post-op pain management ? ?Laterality: Lower and Right ? ?Prep: chloraprep     ?  ?Needles:  ?Injection technique: Single-shot ? ?Needle Type: Echogenic Needle   ? ? ?Needle Length: 10cm  ?Needle Gauge: 20  ? ? ? ?Additional Needles: ? ? ?Procedures:,,,, ultrasound used (permanent image in chart),,    ?Narrative:  ?End time: 03/09/2022 2:35 PM ?Injection made incrementally with aspirations every 5 mL. ? ?Performed by: Personally  ?Anesthesiologist: Piscitello, Precious Haws, MD ? ?Additional Notes: ?Patient consented for risk and benefits of nerve block including but not limited to nerve damage, failed block, bleeding and infection.  Patient voiced understanding. ? ?Functioning IV was confirmed and monitors were applied.  Timeout done prior to procedure and prior to any sedation being given to the patient.  Patient confirmed procedure site prior to any sedation given to the patient.    Sterile prep,hand hygiene and sterile gloves were used.  Minimal sedation used for procedure.  No paresthesia endorsed by patient during the procedure.  Negative aspiration and negative test dose prior to incremental administration of local anesthetic. The patient tolerated the procedure well with no immediate complications. ? ? ? ?

## 2022-03-10 ENCOUNTER — Encounter: Payer: Self-pay | Admitting: Orthopedic Surgery

## 2022-03-11 ENCOUNTER — Encounter: Payer: Self-pay | Admitting: Orthopedic Surgery

## 2022-03-17 NOTE — Anesthesia Postprocedure Evaluation (Signed)
Anesthesia Post Note ? ?Patient: Lori Banks ? ?Procedure(s) Performed: OPEN REDUCTION INTERNAL FIXATION (ORIF) ANKLE FRACTURE (Right: Ankle) ? ?Patient location during evaluation: PACU ?Anesthesia Type: Regional ?Level of consciousness: awake and alert ?Pain management: pain level controlled ?Vital Signs Assessment: post-procedure vital signs reviewed and stable ?Respiratory status: spontaneous breathing, nonlabored ventilation, respiratory function stable and patient connected to nasal cannula oxygen ?Cardiovascular status: blood pressure returned to baseline and stable ?Postop Assessment: no apparent nausea or vomiting ?Anesthetic complications: no ? ? ?No notable events documented. ? ? ?Last Vitals:  ?Vitals:  ? 03/09/22 1519 03/09/22 1549  ?BP: (!) 146/75 (!) 142/74  ?Pulse: (!) 59   ?Resp: 20 20  ?Temp: (!) 36.1 ?C   ?SpO2: 96% 99%  ?  ?Last Pain:  ?Vitals:  ? 03/09/22 1519  ?TempSrc: Temporal  ?PainSc:   ? ? ?  ?  ?  ?  ?  ?  ? ?Molli Barrows ? ? ? ? ?

## 2022-06-16 ENCOUNTER — Other Ambulatory Visit: Payer: Self-pay

## 2022-06-16 ENCOUNTER — Encounter: Payer: Self-pay | Admitting: Emergency Medicine

## 2022-06-16 ENCOUNTER — Emergency Department: Payer: Medicare (Managed Care)

## 2022-06-16 ENCOUNTER — Inpatient Hospital Stay
Admission: EM | Admit: 2022-06-16 | Discharge: 2022-06-19 | DRG: 418 | Disposition: A | Discharge status: Home / Self Care | Payer: Medicare (Managed Care) | Attending: Internal Medicine | Admitting: Internal Medicine

## 2022-06-16 DIAGNOSIS — M069 Rheumatoid arthritis, unspecified: Secondary | ICD-10-CM | POA: Diagnosis present

## 2022-06-16 DIAGNOSIS — R1011 Right upper quadrant pain: Secondary | ICD-10-CM | POA: Diagnosis not present

## 2022-06-16 DIAGNOSIS — D849 Immunodeficiency, unspecified: Secondary | ICD-10-CM

## 2022-06-16 DIAGNOSIS — E785 Hyperlipidemia, unspecified: Secondary | ICD-10-CM | POA: Diagnosis present

## 2022-06-16 DIAGNOSIS — K8001 Calculus of gallbladder with acute cholecystitis with obstruction: Principal | ICD-10-CM | POA: Diagnosis present

## 2022-06-16 DIAGNOSIS — N139 Obstructive and reflux uropathy, unspecified: Secondary | ICD-10-CM

## 2022-06-16 DIAGNOSIS — K82A1 Gangrene of gallbladder in cholecystitis: Secondary | ICD-10-CM | POA: Diagnosis present

## 2022-06-16 DIAGNOSIS — Z20822 Contact with and (suspected) exposure to covid-19: Secondary | ICD-10-CM | POA: Diagnosis present

## 2022-06-16 DIAGNOSIS — D72828 Other elevated white blood cell count: Secondary | ICD-10-CM

## 2022-06-16 DIAGNOSIS — J449 Chronic obstructive pulmonary disease, unspecified: Secondary | ICD-10-CM | POA: Diagnosis present

## 2022-06-16 DIAGNOSIS — I700 Atherosclerosis of aorta: Secondary | ICD-10-CM | POA: Diagnosis present

## 2022-06-16 DIAGNOSIS — N132 Hydronephrosis with renal and ureteral calculous obstruction: Secondary | ICD-10-CM | POA: Diagnosis present

## 2022-06-16 DIAGNOSIS — K810 Acute cholecystitis: Principal | ICD-10-CM

## 2022-06-16 DIAGNOSIS — K5730 Diverticulosis of large intestine without perforation or abscess without bleeding: Secondary | ICD-10-CM | POA: Diagnosis present

## 2022-06-16 DIAGNOSIS — Z87891 Personal history of nicotine dependence: Secondary | ICD-10-CM

## 2022-06-16 DIAGNOSIS — Z79631 Long term (current) use of antimetabolite agent: Secondary | ICD-10-CM

## 2022-06-16 DIAGNOSIS — Z87442 Personal history of urinary calculi: Secondary | ICD-10-CM

## 2022-06-16 DIAGNOSIS — N200 Calculus of kidney: Secondary | ICD-10-CM

## 2022-06-16 HISTORY — DX: Calculus of kidney: N20.0

## 2022-06-16 LAB — CBC WITH DIFFERENTIAL/PLATELET
Abs Immature Granulocytes: 0.06 10*3/uL (ref 0.00–0.07)
Basophils Absolute: 0 10*3/uL (ref 0.0–0.1)
Basophils Relative: 0 %
Eosinophils Absolute: 0 10*3/uL (ref 0.0–0.5)
Eosinophils Relative: 0 %
HCT: 35.9 % — ABNORMAL LOW (ref 36.0–46.0)
Hemoglobin: 11.7 g/dL — ABNORMAL LOW (ref 12.0–15.0)
Immature Granulocytes: 0 %
Lymphocytes Relative: 5 %
Lymphs Abs: 0.7 10*3/uL (ref 0.7–4.0)
MCH: 30.1 pg (ref 26.0–34.0)
MCHC: 32.6 g/dL (ref 30.0–36.0)
MCV: 92.3 fL (ref 80.0–100.0)
Monocytes Absolute: 0.5 10*3/uL (ref 0.1–1.0)
Monocytes Relative: 4 %
Neutro Abs: 13 10*3/uL — ABNORMAL HIGH (ref 1.7–7.7)
Neutrophils Relative %: 91 %
Platelets: 247 10*3/uL (ref 150–400)
RBC: 3.89 MIL/uL (ref 3.87–5.11)
RDW: 14.4 % (ref 11.5–15.5)
WBC: 14.3 10*3/uL — ABNORMAL HIGH (ref 4.0–10.5)
nRBC: 0 % (ref 0.0–0.2)

## 2022-06-16 LAB — COMPREHENSIVE METABOLIC PANEL
ALT: 20 U/L (ref 0–44)
AST: 25 U/L (ref 15–41)
Albumin: 4.1 g/dL (ref 3.5–5.0)
Alkaline Phosphatase: 46 U/L (ref 38–126)
Anion gap: 9 (ref 5–15)
BUN: 19 mg/dL (ref 8–23)
CO2: 24 mmol/L (ref 22–32)
Calcium: 9.7 mg/dL (ref 8.9–10.3)
Chloride: 105 mmol/L (ref 98–111)
Creatinine, Ser: 0.74 mg/dL (ref 0.44–1.00)
GFR, Estimated: >60 mL/min (ref >60)
Glucose, Bld: 168 mg/dL — ABNORMAL HIGH (ref 70–99)
Potassium: 3.6 mmol/L (ref 3.5–5.1)
Sodium: 138 mmol/L (ref 135–145)
Total Bilirubin: 0.9 mg/dL (ref 0.3–1.2)
Total Protein: 6.9 g/dL (ref 6.5–8.1)

## 2022-06-16 LAB — URINALYSIS, ROUTINE W REFLEX MICROSCOPIC
Bacteria, UA: NONE SEEN
Bilirubin Urine: NEGATIVE
Glucose, UA: 50 mg/dL — AB
Ketones, ur: 5 mg/dL — AB
Leukocytes,Ua: NEGATIVE
Nitrite: NEGATIVE
Protein, ur: NEGATIVE mg/dL
Specific Gravity, Urine: 1.012 (ref 1.005–1.030)
Squamous Epithelial / HPF: NONE SEEN (ref 0–5)
pH: 8 (ref 5.0–8.0)

## 2022-06-16 LAB — LIPASE, BLOOD: Lipase: 35 U/L (ref 11–51)

## 2022-06-16 MED ORDER — PIPERACILLIN-TAZOBACTAM 3.375 G IVPB 30 MIN
3.3750 g | Freq: Once | INTRAVENOUS | Status: AC
  Administered 2022-06-17: 3.375 g via INTRAVENOUS
  Filled 2022-06-16: qty 50

## 2022-06-16 MED ORDER — MORPHINE SULFATE (PF) 4 MG/ML IV SOLN
4.0000 mg | Freq: Once | INTRAVENOUS | Status: AC
  Administered 2022-06-16: 4 mg via INTRAVENOUS
  Filled 2022-06-16: qty 1

## 2022-06-16 MED ORDER — SODIUM CHLORIDE 0.9 % IV BOLUS
1000.0000 mL | Freq: Once | INTRAVENOUS | Status: AC
  Administered 2022-06-16: 1000 mL via INTRAVENOUS

## 2022-06-16 MED ORDER — ONDANSETRON HCL 4 MG/2ML IJ SOLN
4.0000 mg | Freq: Once | INTRAMUSCULAR | Status: AC
  Administered 2022-06-16: 4 mg via INTRAVENOUS
  Filled 2022-06-16: qty 2

## 2022-06-16 MED ORDER — KETOROLAC TROMETHAMINE 30 MG/ML IJ SOLN
15.0000 mg | Freq: Once | INTRAMUSCULAR | Status: AC
  Administered 2022-06-16: 15 mg via INTRAVENOUS
  Filled 2022-06-16: qty 1

## 2022-06-16 NOTE — ED Triage Notes (Addendum)
EMS brings pt in from home for c/o rt flank pain radiating into abd since this am accomp by N/V, hx kidney stones; pt to triage via w/c, appears uncomfortable

## 2022-06-16 NOTE — Progress Notes (Signed)
PHARMACY -  BRIEF ANTIBIOTIC NOTE   Pharmacy has received consult(s) for Zosyn from an ED provider.  The patient's profile has been reviewed for ht/wt/allergies/indication/available labs.    One time order(s) placed for Zosyn 3.375 gm IV X 1 over 30 min.   Further antibiotics/pharmacy consults should be ordered by admitting physician if indicated.                       Thank you, Raha Tennison D 06/16/2022  10:12 PM

## 2022-06-16 NOTE — ED Provider Notes (Signed)
[facility] Provider Note    Event Date/Time   First MD Initiated Contact with Patient 06/16/22 2129     (approximate)   History   Flank Pain   HPI  Lori Banks is a 61 y.o. female  here with flank pain. Pt reports that over the past day, since around 8-9 AM, she has had R flank pain. Pain began initially mild but has progressively worsened. It is now severe, 10/10, and with nausea. No vomiting. She has not wanted or been able to eat/drink all day. No constipation. She did have some pudding like stools. No fevers. No recent med changes. H/o kidney stones. H/o RA on xeljanz.       Physical Exam   Triage Vital Signs: ED Triage Vitals  Enc Vitals Group     BP 06/16/22 2114 137/75     Pulse Rate 06/16/22 2114 65     Resp 06/16/22 2114 18     Temp 06/16/22 2114 97.8 F (36.6 C)     Temp Source 06/16/22 2114 Oral     SpO2 06/16/22 2114 100 %     Weight 06/16/22 2109 173 lb (78.5 kg)     Height 06/16/22 2109 5\' 6"  (1.676 m)     Head Circumference --      Peak Flow --      Pain Score 06/16/22 2109 9     Pain Loc --      Pain Edu? --      Excl. in GC? --     Most recent vital signs: Vitals:   06/16/22 2114  BP: 137/75  Pulse: 65  Resp: 18  Temp: 97.8 F (36.6 C)  SpO2: 100%     General: Awake, no distress.  CV:  Good peripheral perfusion. RRR. No murmurs. Resp:  Normal effort. Lungs CTAB.  Abd:  No distention. Significant TTP throughout RUQ, R flank, RLQ.  Other:  MMM.   ED Results / Procedures / Treatments   Labs (all labs ordered are listed, but only abnormal results are displayed) Labs Reviewed  CBC WITH DIFFERENTIAL/PLATELET - Abnormal; Notable for the following components:      Result Value   WBC 14.3 (*)    Hemoglobin 11.7 (*)    HCT 35.9 (*)    Neutro Abs 13.0 (*)    All other components within normal limits  COMPREHENSIVE METABOLIC PANEL - Abnormal; Notable for the following components:   Glucose, Bld 168 (*)     All other components within normal limits  URINE CULTURE  CULTURE, BLOOD (ROUTINE X 2)  CULTURE, BLOOD (ROUTINE X 2)  SARS CORONAVIRUS 2 BY RT PCR  LIPASE, BLOOD  URINALYSIS, ROUTINE W REFLEX MICROSCOPIC     EKG    RADIOLOGY CT Stone: Cholecystitis, also with left renal stone and obstructive hydronephrosis   I also independently reviewed and agree with radiologist interpretations.   PROCEDURES:  Critical Care performed: No  .1-3 Lead EKG Interpretation  Performed by: [id number], MD Authorized by: Shaune Pollack, MD     Interpretation: normal     ECG rate:  60-80   ECG rate assessment: normal     Rhythm: sinus rhythm     Ectopy: none     Conduction: normal   Comments:     Indication: Weakness, pain     MEDICATIONS ORDERED IN ED: Medications  piperacillin-tazobactam (ZOSYN) IVPB 3.375 g (has no administration in time range)  sodium chloride 0.9 % bolus 1,000 mL (  1,000 mLs Intravenous New Bag/Given 06/16/22 2135)  morphine (PF) 4 MG/ML injection 4 mg (4 mg Intravenous Given 06/16/22 2136)  ondansetron (ZOFRAN) injection 4 mg (4 mg Intravenous Given 06/16/22 2136)  ketorolac (TORADOL) 30 MG/ML injection 15 mg (15 mg Intravenous Given 06/16/22 2136)  morphine (PF) 4 MG/ML injection 4 mg (4 mg Intravenous Given 06/16/22 2211)     IMPRESSION / MDM / ASSESSMENT AND PLAN / ED COURSE  I reviewed the triage vital signs and the nursing notes.                               The patient is on the cardiac monitor to evaluate for evidence of arrhythmia and/or significant heart rate changes.   Ddx:  Differential includes the following, with pertinent life- or limb-threatening emergencies considered:  Cholecystitis, nephrolithiasis, pyelonephritis, diverticulitis, colitis, obstruction, volvulus  Patient's presentation is most consistent with acute presentation with potential threat to life or bodily function.  MDM:  61 yo F here with severe R sided abd pain, also L  flank pain. Initial concern for possible nephrolithiasis, pt sent for CT stone study which actually shows acute cholecystitis as well as 6 mm obstructing stone on the left. Pt does have L flank pain clinically. Pt afebrile but is immunosuppressed on Xeljanz/MTX for her RA. Will give Zosyn empirically. RUQ US obtained, reviewed by me, and is c/w choelcystitis with wall thickening, pericholecystic fluid, stones. UA is pending. Labs show leukocytosis, reassuring CMP with normal LFts and bili.  Discussed with Dr. Aleen Campi of Gen Surg - pt will need chole likely tomorrow AM. He asks that we discuss with Urology - UA is pending. Plan to admit to medicine.    MEDICATIONS GIVEN IN ED: Medications  piperacillin-tazobactam (ZOSYN) IVPB 3.375 g (has no administration in time range)  sodium chloride 0.9 % bolus 1,000 mL (1,000 mLs Intravenous New Bag/Given 06/16/22 2135)  morphine (PF) 4 MG/ML injection 4 mg (4 mg Intravenous Given 06/16/22 2136)  ondansetron (ZOFRAN) injection 4 mg (4 mg Intravenous Given 06/16/22 2136)  ketorolac (TORADOL) 30 MG/ML injection 15 mg (15 mg Intravenous Given 06/16/22 2136)  morphine (PF) 4 MG/ML injection 4 mg (4 mg Intravenous Given 06/16/22 2211)     Consults:  Dr. Aleen Campi, Gen Surgery   EMR reviewed  Rheum clinic notes, Dr. Gavin Potters with [facility]     FINAL CLINICAL IMPRESSION(S) / ED DIAGNOSES   Final diagnoses:  Acute cholecystitis  Left nephrolithiasis  Other elevated white blood cell (WBC) count  Immunosuppressed status (HCC)     Rx / DC Orders   ED Discharge Orders     None        Note:  This document was prepared using Dragon voice recognition software and may include unintentional dictation errors.   Shaune Pollack, MD 06/16/22 2329

## 2022-06-16 NOTE — ED Notes (Signed)
Pt assisted to toilet for urine specimen collection then back to bed.

## 2022-06-16 NOTE — ED Provider Notes (Addendum)
Patient signed out to me pending UA discussion with urology and admission.  She is a 62 year old female presenting primarily with right upper quadrant/flank pain but also with some mild left flank pain.  She is found to have cholecystitis both on ultrasound and CT as well as a 6 mm obstructing stone.  She is afebrile.  She has a leukocytosis of 14.  UA has no white cells.  Dr. Aleen Campi with general surgery has been consulted and is planning to take the patient for cholecystectomy tomorrow.  I will discuss with Dr. Joselyn Glassman with urology as well as the hospitalist for admission.  Blood cultures will be drawn and she will be covered with Zosyn.  I have spoken with Dr. Signa Kell with urology.  He will review the CT and see her in the a.m. and decide whether she will undergo intervention during the cholecystectomy.  Georga Hacking, MD 06/17/22 0001    Georga Hacking, MD 06/17/22 [phone]

## 2022-06-17 ENCOUNTER — Other Ambulatory Visit: Payer: Self-pay

## 2022-06-17 ENCOUNTER — Inpatient Hospital Stay: Payer: Medicare (Managed Care) | Admitting: Registered Nurse

## 2022-06-17 ENCOUNTER — Encounter
Admission: EM | Disposition: A | Discharge status: Home / Self Care | Payer: Self-pay | Source: Home / Self Care | Attending: Internal Medicine

## 2022-06-17 ENCOUNTER — Other Ambulatory Visit: Payer: Self-pay | Admitting: Urology

## 2022-06-17 ENCOUNTER — Encounter: Payer: Self-pay | Admitting: Family Medicine

## 2022-06-17 ENCOUNTER — Inpatient Hospital Stay: Payer: Medicare (Managed Care)

## 2022-06-17 DIAGNOSIS — Z20822 Contact with and (suspected) exposure to covid-19: Secondary | ICD-10-CM | POA: Diagnosis present

## 2022-06-17 DIAGNOSIS — N139 Obstructive and reflux uropathy, unspecified: Secondary | ICD-10-CM

## 2022-06-17 DIAGNOSIS — N132 Hydronephrosis with renal and ureteral calculous obstruction: Secondary | ICD-10-CM | POA: Diagnosis present

## 2022-06-17 DIAGNOSIS — N201 Calculus of ureter: Secondary | ICD-10-CM

## 2022-06-17 DIAGNOSIS — I700 Atherosclerosis of aorta: Secondary | ICD-10-CM | POA: Diagnosis present

## 2022-06-17 DIAGNOSIS — K810 Acute cholecystitis: Secondary | ICD-10-CM

## 2022-06-17 DIAGNOSIS — J449 Chronic obstructive pulmonary disease, unspecified: Secondary | ICD-10-CM

## 2022-06-17 DIAGNOSIS — E785 Hyperlipidemia, unspecified: Secondary | ICD-10-CM | POA: Diagnosis present

## 2022-06-17 DIAGNOSIS — R1011 Right upper quadrant pain: Secondary | ICD-10-CM | POA: Diagnosis present

## 2022-06-17 DIAGNOSIS — K5730 Diverticulosis of large intestine without perforation or abscess without bleeding: Secondary | ICD-10-CM | POA: Diagnosis present

## 2022-06-17 DIAGNOSIS — K82A1 Gangrene of gallbladder in cholecystitis: Secondary | ICD-10-CM | POA: Diagnosis present

## 2022-06-17 DIAGNOSIS — M069 Rheumatoid arthritis, unspecified: Secondary | ICD-10-CM | POA: Diagnosis present

## 2022-06-17 DIAGNOSIS — Z79631 Long term (current) use of antimetabolite agent: Secondary | ICD-10-CM | POA: Diagnosis not present

## 2022-06-17 DIAGNOSIS — Z87891 Personal history of nicotine dependence: Secondary | ICD-10-CM | POA: Diagnosis not present

## 2022-06-17 DIAGNOSIS — K8001 Calculus of gallbladder with acute cholecystitis with obstruction: Secondary | ICD-10-CM | POA: Diagnosis present

## 2022-06-17 DIAGNOSIS — Z87442 Personal history of urinary calculi: Secondary | ICD-10-CM | POA: Diagnosis not present

## 2022-06-17 HISTORY — PX: CYSTOSCOPY WITH STENT PLACEMENT: SHX5790

## 2022-06-17 LAB — CBC
HCT: 36.6 % (ref 36.0–46.0)
Hemoglobin: 11.8 g/dL — ABNORMAL LOW (ref 12.0–15.0)
MCH: 30.1 pg (ref 26.0–34.0)
MCHC: 32.2 g/dL (ref 30.0–36.0)
MCV: 93.4 fL (ref 80.0–100.0)
Platelets: 232 10*3/uL (ref 150–400)
RBC: 3.92 MIL/uL (ref 3.87–5.11)
RDW: 14.6 % (ref 11.5–15.5)
WBC: 12.4 10*3/uL — ABNORMAL HIGH (ref 4.0–10.5)
nRBC: 0 % (ref 0.0–0.2)

## 2022-06-17 LAB — COMPREHENSIVE METABOLIC PANEL
ALT: 98 U/L — ABNORMAL HIGH (ref 0–44)
AST: 124 U/L — ABNORMAL HIGH (ref 15–41)
Albumin: 3.5 g/dL (ref 3.5–5.0)
Alkaline Phosphatase: 48 U/L (ref 38–126)
Anion gap: 6 (ref 5–15)
BUN: 16 mg/dL (ref 8–23)
CO2: 26 mmol/L (ref 22–32)
Calcium: 9.4 mg/dL (ref 8.9–10.3)
Chloride: 108 mmol/L (ref 98–111)
Creatinine, Ser: 0.91 mg/dL (ref 0.44–1.00)
GFR, Estimated: >60 mL/min (ref >60)
Glucose, Bld: 139 mg/dL — ABNORMAL HIGH (ref 70–99)
Potassium: 4 mmol/L (ref 3.5–5.1)
Sodium: 140 mmol/L (ref 135–145)
Total Bilirubin: 1.2 mg/dL (ref 0.3–1.2)
Total Protein: 6.4 g/dL — ABNORMAL LOW (ref 6.5–8.1)

## 2022-06-17 LAB — SARS CORONAVIRUS 2 BY RT PCR: SARS Coronavirus 2 by RT PCR: NEGATIVE

## 2022-06-17 LAB — HIV ANTIBODY (ROUTINE TESTING W REFLEX): HIV Screen 4th Generation wRfx: NONREACTIVE

## 2022-06-17 SURGERY — CHOLECYSTECTOMY, ROBOT-ASSISTED, LAPAROSCOPIC
Anesthesia: General | Site: Ureter

## 2022-06-17 MED ORDER — SUCCINYLCHOLINE CHLORIDE 200 MG/10ML IV SOSY
PREFILLED_SYRINGE | INTRAVENOUS | Status: AC
  Filled 2022-06-17: qty 10

## 2022-06-17 MED ORDER — PROMETHAZINE HCL 25 MG/ML IJ SOLN: 6.2500 mg | INTRAMUSCULAR | Status: DC | PRN

## 2022-06-17 MED ORDER — IOHEXOL 180 MG/ML  SOLN
INTRAMUSCULAR | Status: DC | PRN
  Administered 2022-06-17: 10 mL

## 2022-06-17 MED ORDER — ATORVASTATIN CALCIUM 10 MG PO TABS
10.0000 mg | ORAL_TABLET | Freq: Every day | ORAL | Status: DC
  Administered 2022-06-17 – 2022-06-19 (×3): 10 mg via ORAL
  Filled 2022-06-17 (×3): qty 1

## 2022-06-17 MED ORDER — KETAMINE HCL 10 MG/ML IJ SOLN
INTRAMUSCULAR | Status: DC | PRN
  Administered 2022-06-17 (×5): 10 mg via INTRAVENOUS

## 2022-06-17 MED ORDER — SODIUM CHLORIDE 0.9 % IR SOLN
Status: DC | PRN
  Administered 2022-06-17: 1000 mL

## 2022-06-17 MED ORDER — DOCUSATE SODIUM 100 MG PO CAPS
100.0000 mg | ORAL_CAPSULE | Freq: Every day | ORAL | Status: DC | PRN
Start: 2022-06-17 — End: 2022-06-19

## 2022-06-17 MED ORDER — MAGNESIUM HYDROXIDE 400 MG/5ML PO SUSP: 30.0000 mL | Freq: Every day | ORAL | Status: DC | PRN

## 2022-06-17 MED ORDER — 0.9 % SODIUM CHLORIDE (POUR BTL) OPTIME
TOPICAL | Status: DC | PRN
  Administered 2022-06-17: 500 mL

## 2022-06-17 MED ORDER — ONDANSETRON HCL 4 MG/2ML IJ SOLN
INTRAMUSCULAR | Status: AC
  Filled 2022-06-17: qty 2

## 2022-06-17 MED ORDER — VITAMIN D (ERGOCALCIFEROL) 1.25 MG (50000 UNIT) PO CAPS
50000.0000 [IU] | ORAL_CAPSULE | ORAL | Status: DC
Start: 2022-06-21 — End: 2022-06-19

## 2022-06-17 MED ORDER — PROPOFOL 10 MG/ML IV BOLUS
INTRAVENOUS | Status: AC
  Filled 2022-06-17: qty 20

## 2022-06-17 MED ORDER — SUGAMMADEX SODIUM 200 MG/2ML IV SOLN
INTRAVENOUS | Status: DC | PRN
  Administered 2022-06-17: 200 mg via INTRAVENOUS

## 2022-06-17 MED ORDER — PIPERACILLIN-TAZOBACTAM 3.375 G IVPB
3.3750 g | Freq: Three times a day (TID) | INTRAVENOUS | Status: DC
  Administered 2022-06-17 – 2022-06-19 (×5): 3.375 g via INTRAVENOUS
  Filled 2022-06-17 (×7): qty 50

## 2022-06-17 MED ORDER — SODIUM CHLORIDE 0.9 % IR SOLN
Status: DC | PRN
  Administered 2022-06-17: 1000 mL
  Administered 2022-06-17: 3000 mL via INTRAVESICAL

## 2022-06-17 MED ORDER — INDOCYANINE GREEN 25 MG IV SOLR
2.5000 mg | Freq: Once | INTRAVENOUS | Status: AC
  Administered 2022-06-17: 2.5 mg via INTRAVENOUS
  Filled 2022-06-17: qty 1

## 2022-06-17 MED ORDER — DEXAMETHASONE SODIUM PHOSPHATE 10 MG/ML IJ SOLN
INTRAMUSCULAR | Status: AC
  Filled 2022-06-17: qty 1

## 2022-06-17 MED ORDER — FENTANYL CITRATE (PF) 100 MCG/2ML IJ SOLN
INTRAMUSCULAR | Status: DC | PRN
  Administered 2022-06-17 (×4): 25 ug via INTRAVENOUS

## 2022-06-17 MED ORDER — LIDOCAINE HCL (CARDIAC) PF 100 MG/5ML IV SOSY
PREFILLED_SYRINGE | INTRAVENOUS | Status: DC | PRN
  Administered 2022-06-17: 100 mg via INTRAVENOUS

## 2022-06-17 MED ORDER — FENTANYL CITRATE (PF) 100 MCG/2ML IJ SOLN: 25.0000 ug | INTRAMUSCULAR | Status: DC | PRN

## 2022-06-17 MED ORDER — OXYCODONE HCL 5 MG PO TABS
5.0000 mg | ORAL_TABLET | ORAL | Status: DC | PRN
  Administered 2022-06-18 – 2022-06-19 (×4): 5 mg via ORAL
  Filled 2022-06-17 (×4): qty 1

## 2022-06-17 MED ORDER — DICLOFENAC SODIUM 1 % EX GEL
2.0000 g | Freq: Two times a day (BID) | CUTANEOUS | Status: DC | PRN
Start: 2022-06-17 — End: 2022-06-19

## 2022-06-17 MED ORDER — PHENYLEPHRINE HCL-NACL 20-0.9 MG/250ML-% IV SOLN
INTRAVENOUS | Status: AC
  Filled 2022-06-17: qty 250

## 2022-06-17 MED ORDER — KETOROLAC TROMETHAMINE 30 MG/ML IJ SOLN
INTRAMUSCULAR | Status: AC
  Filled 2022-06-17: qty 1

## 2022-06-17 MED ORDER — ACETAMINOPHEN 325 MG PO TABS: 650.0000 mg | ORAL_TABLET | Freq: Four times a day (QID) | ORAL | Status: DC | PRN

## 2022-06-17 MED ORDER — ROCURONIUM BROMIDE 10 MG/ML (PF) SYRINGE
PREFILLED_SYRINGE | INTRAVENOUS | Status: AC
  Filled 2022-06-17: qty 10

## 2022-06-17 MED ORDER — MIDAZOLAM HCL 2 MG/2ML IJ SOLN
INTRAMUSCULAR | Status: AC
  Filled 2022-06-17: qty 2

## 2022-06-17 MED ORDER — FENTANYL CITRATE (PF) 100 MCG/2ML IJ SOLN
INTRAMUSCULAR | Status: AC
  Filled 2022-06-17: qty 2

## 2022-06-17 MED ORDER — PHENYLEPHRINE HCL (PRESSORS) 10 MG/ML IV SOLN
INTRAVENOUS | Status: DC | PRN
  Administered 2022-06-17 (×3): 160 ug via INTRAVENOUS
  Administered 2022-06-17: 320 ug via INTRAVENOUS
  Administered 2022-06-17 (×5): 160 ug via INTRAVENOUS

## 2022-06-17 MED ORDER — MIDAZOLAM HCL 2 MG/2ML IJ SOLN
INTRAMUSCULAR | Status: DC | PRN
  Administered 2022-06-17: 2 mg via INTRAVENOUS

## 2022-06-17 MED ORDER — ONDANSETRON HCL 4 MG/2ML IJ SOLN: 4.0000 mg | Freq: Four times a day (QID) | INTRAMUSCULAR | Status: DC | PRN

## 2022-06-17 MED ORDER — PHENYLEPHRINE HCL-NACL 20-0.9 MG/250ML-% IV SOLN
INTRAVENOUS | Status: DC | PRN
  Administered 2022-06-17: 30 ug/min via INTRAVENOUS

## 2022-06-17 MED ORDER — ACETAMINOPHEN 10 MG/ML IV SOLN
INTRAVENOUS | Status: DC | PRN
  Administered 2022-06-17: 1000 mg via INTRAVENOUS

## 2022-06-17 MED ORDER — OXYCODONE-ACETAMINOPHEN 5-325 MG PO TABS
ORAL_TABLET | ORAL | Status: AC
  Filled 2022-06-17: qty 1

## 2022-06-17 MED ORDER — DEXAMETHASONE SODIUM PHOSPHATE 10 MG/ML IJ SOLN
INTRAMUSCULAR | Status: DC | PRN
  Administered 2022-06-17: 5 mg via INTRAVENOUS

## 2022-06-17 MED ORDER — LACTATED RINGERS IV SOLN: INTRAVENOUS | Status: DC | PRN

## 2022-06-17 MED ORDER — MORPHINE SULFATE (PF) 2 MG/ML IV SOLN
2.0000 mg | INTRAVENOUS | Status: DC | PRN
  Administered 2022-06-17 (×2): 2 mg via INTRAVENOUS
  Filled 2022-06-17 (×2): qty 1

## 2022-06-17 MED ORDER — ACETAMINOPHEN 325 MG RE SUPP: 650.0000 mg | Freq: Four times a day (QID) | RECTAL | Status: DC | PRN

## 2022-06-17 MED ORDER — PROPOFOL 10 MG/ML IV BOLUS
INTRAVENOUS | Status: DC | PRN
  Administered 2022-06-17: 30 mg via INTRAVENOUS
  Administered 2022-06-17: 120 mg via INTRAVENOUS

## 2022-06-17 MED ORDER — LIDOCAINE HCL (PF) 2 % IJ SOLN
INTRAMUSCULAR | Status: AC
  Filled 2022-06-17: qty 5

## 2022-06-17 MED ORDER — TRAZODONE HCL 50 MG PO TABS: 25.0000 mg | ORAL_TABLET | Freq: Every evening | ORAL | Status: DC | PRN

## 2022-06-17 MED ORDER — HYDROMORPHONE HCL 1 MG/ML IJ SOLN: 0.5000 mg | INTRAMUSCULAR | Status: DC | PRN

## 2022-06-17 MED ORDER — PHENYLEPHRINE 80 MCG/ML (10ML) SYRINGE FOR IV PUSH (FOR BLOOD PRESSURE SUPPORT)
PREFILLED_SYRINGE | INTRAVENOUS | Status: AC
  Filled 2022-06-17: qty 10

## 2022-06-17 MED ORDER — OXYCODONE-ACETAMINOPHEN 5-325 MG PO TABS
1.0000 | ORAL_TABLET | ORAL | Status: DC | PRN
  Administered 2022-06-17: 1 via ORAL

## 2022-06-17 MED ORDER — BUPIVACAINE-EPINEPHRINE (PF) 0.25% -1:200000 IJ SOLN
INTRAMUSCULAR | Status: AC
  Filled 2022-06-17: qty 30

## 2022-06-17 MED ORDER — BUPIVACAINE-EPINEPHRINE (PF) 0.25% -1:200000 IJ SOLN
INTRAMUSCULAR | Status: DC | PRN
  Administered 2022-06-17: 50 mL via INTRAMUSCULAR

## 2022-06-17 MED ORDER — POTASSIUM CHLORIDE IN NACL 20-0.9 MEQ/L-% IV SOLN
INTRAVENOUS | Status: DC
  Filled 2022-06-17 (×5): qty 1000

## 2022-06-17 MED ORDER — ACETAMINOPHEN 500 MG PO TABS
1000.0000 mg | ORAL_TABLET | Freq: Four times a day (QID) | ORAL | Status: DC | PRN
  Administered 2022-06-17 – 2022-06-19 (×3): 1000 mg via ORAL
  Filled 2022-06-17 (×3): qty 2

## 2022-06-17 MED ORDER — EPHEDRINE 5 MG/ML INJ
INTRAVENOUS | Status: AC
  Filled 2022-06-17: qty 5

## 2022-06-17 MED ORDER — ADULT MULTIVITAMIN W/MINERALS CH
1.0000 | ORAL_TABLET | Freq: Every day | ORAL | Status: DC
  Administered 2022-06-17 – 2022-06-19 (×3): 1 via ORAL
  Filled 2022-06-17 (×3): qty 1

## 2022-06-17 MED ORDER — PIPERACILLIN-TAZOBACTAM 3.375 G IVPB
3.3750 g | Freq: Three times a day (TID) | INTRAVENOUS | Status: DC
  Administered 2022-06-17: 3.375 g via INTRAVENOUS

## 2022-06-17 MED ORDER — POLYETHYLENE GLYCOL 3350 17 G PO PACK
17.0000 g | PACK | Freq: Every day | ORAL | Status: DC
  Administered 2022-06-18: 17 g via ORAL
  Filled 2022-06-17 (×3): qty 1

## 2022-06-17 MED ORDER — ROCURONIUM BROMIDE 100 MG/10ML IV SOLN
INTRAVENOUS | Status: DC | PRN
  Administered 2022-06-17: 40 mg via INTRAVENOUS
  Administered 2022-06-17: 50 mg via INTRAVENOUS
  Administered 2022-06-17: 10 mg via INTRAVENOUS

## 2022-06-17 MED ORDER — FOLIC ACID 1 MG PO TABS
1.0000 mg | ORAL_TABLET | Freq: Every day | ORAL | Status: DC
  Administered 2022-06-17 – 2022-06-19 (×3): 1 mg via ORAL
  Filled 2022-06-17 (×3): qty 1

## 2022-06-17 MED ORDER — KETOROLAC TROMETHAMINE 30 MG/ML IJ SOLN
INTRAMUSCULAR | Status: DC | PRN
  Administered 2022-06-17: 30 mg via INTRAVENOUS

## 2022-06-17 MED ORDER — ONDANSETRON HCL 4 MG/2ML IJ SOLN
INTRAMUSCULAR | Status: DC | PRN
  Administered 2022-06-17: 4 mg via INTRAVENOUS

## 2022-06-17 MED ORDER — BUPIVACAINE LIPOSOME 1.3 % IJ SUSP
INTRAMUSCULAR | Status: AC
  Filled 2022-06-17: qty 10

## 2022-06-17 MED ORDER — EPHEDRINE SULFATE (PRESSORS) 50 MG/ML IJ SOLN
INTRAMUSCULAR | Status: DC | PRN
  Administered 2022-06-17 (×3): 5 mg via INTRAVENOUS

## 2022-06-17 MED ORDER — ONDANSETRON HCL 4 MG PO TABS: 4.0000 mg | ORAL_TABLET | Freq: Four times a day (QID) | ORAL | Status: DC | PRN

## 2022-06-17 MED ORDER — KETAMINE HCL 50 MG/5ML IJ SOSY
PREFILLED_SYRINGE | INTRAMUSCULAR | Status: AC
  Filled 2022-06-17: qty 5

## 2022-06-17 MED ORDER — PIPERACILLIN-TAZOBACTAM 3.375 G IVPB
INTRAVENOUS | Status: AC
  Filled 2022-06-17: qty 50

## 2022-06-17 MED ORDER — ACETAMINOPHEN 10 MG/ML IV SOLN
INTRAVENOUS | Status: AC
  Filled 2022-06-17: qty 100

## 2022-06-17 SURGICAL SUPPLY — 73 items
BAG DRAIN CYSTO-URO LG1000N (MISCELLANEOUS) ×4 IMPLANT
BAG PRESSURE INF REUSE 1000 (BAG) ×1 IMPLANT
BRUSH SCRUB EZ 1% IODOPHOR (MISCELLANEOUS) IMPLANT
BULB RESERV EVAC DRAIN JP 100C (MISCELLANEOUS) ×1 IMPLANT
CANNULA REDUC XI 12-8 STAPL (CANNULA) ×1
CANNULA REDUCER 12-8 DVNC XI (CANNULA) ×3 IMPLANT
CATH URETL 5X70 OPEN END (CATHETERS) ×1 IMPLANT
CATH URETL OPEN 5X70 (CATHETERS) ×4 IMPLANT
CLIP LIGATING HEMO O LOK GREEN (MISCELLANEOUS) ×4 IMPLANT
CUP MEDICINE 2OZ PLAST GRAD ST (MISCELLANEOUS) ×4 IMPLANT
DERMABOND ADVANCED (GAUZE/BANDAGES/DRESSINGS) ×1
DERMABOND ADVANCED .7 DNX12 (GAUZE/BANDAGES/DRESSINGS) ×3 IMPLANT
DRAIN CHANNEL 19F RND (DRAIN) ×1 IMPLANT
DRAPE ARM DVNC X/XI (DISPOSABLE) ×12 IMPLANT
DRAPE COLUMN DVNC XI (DISPOSABLE) ×3 IMPLANT
DRAPE DA VINCI XI ARM (DISPOSABLE) ×4
DRAPE DA VINCI XI COLUMN (DISPOSABLE) ×1
ELECT CAUTERY BLADE TIP 2.5 (TIP) ×4
ELECT REM PT RETURN 9FT ADLT (ELECTROSURGICAL) ×4
ELECTRODE CAUTERY BLDE TIP 2.5 (TIP) ×3 IMPLANT
ELECTRODE REM PT RTRN 9FT ADLT (ELECTROSURGICAL) ×3 IMPLANT
GAUZE 4X4 16PLY ~~LOC~~+RFID DBL (SPONGE) ×8 IMPLANT
GLOVE SURG SYN 7.0 (GLOVE) ×8 IMPLANT
GLOVE SURG SYN 7.0 PF PI (GLOVE) ×6 IMPLANT
GLOVE SURG SYN 7.5  E (GLOVE) ×2
GLOVE SURG SYN 7.5 E (GLOVE) ×6 IMPLANT
GLOVE SURG SYN 7.5 PF PI (GLOVE) ×6 IMPLANT
GLOVE SURG UNDER POLY LF SZ7.5 (GLOVE) ×4 IMPLANT
GOWN STRL REUS W/ TWL LRG LVL3 (GOWN DISPOSABLE) ×15 IMPLANT
GOWN STRL REUS W/ TWL XL LVL3 (GOWN DISPOSABLE) ×3 IMPLANT
GOWN STRL REUS W/TWL LRG LVL3 (GOWN DISPOSABLE) ×5
GOWN STRL REUS W/TWL XL LVL3 (GOWN DISPOSABLE) ×1
GUIDEWIRE STR DUAL SENSOR (WIRE) ×4 IMPLANT
IRRIGATOR SUCT 8 DISP DVNC XI (IRRIGATION / IRRIGATOR) IMPLANT
IRRIGATOR SUCTION 8MM XI DISP (IRRIGATION / IRRIGATOR) ×1
IV NS 1000ML (IV SOLUTION) ×1
IV NS 1000ML BAXH (IV SOLUTION) IMPLANT
IV NS IRRIG 3000ML ARTHROMATIC (IV SOLUTION) ×4 IMPLANT
KIT PINK PAD W/HEAD ARE REST (MISCELLANEOUS) ×4 IMPLANT
KIT PINK PAD W/HEAD ARM REST (MISCELLANEOUS) ×3 IMPLANT
KIT TURNOVER CYSTO (KITS) ×4 IMPLANT
MANIFOLD NEPTUNE II (INSTRUMENTS) ×8 IMPLANT
NEEDLE HYPO 22GX1.5 SAFETY (NEEDLE) ×4 IMPLANT
NS IRRIG 500ML POUR BTL (IV SOLUTION) ×4 IMPLANT
OBTURATOR OPTICAL STANDARD 8MM (TROCAR) ×1
OBTURATOR OPTICAL STND 8 DVNC (TROCAR) ×3
OBTURATOR OPTICALSTD 8 DVNC (TROCAR) ×3 IMPLANT
PACK CYSTO AR (MISCELLANEOUS) ×4 IMPLANT
PACK LAP CHOLECYSTECTOMY (MISCELLANEOUS) ×4 IMPLANT
PENCIL ELECTRO HAND CTR (MISCELLANEOUS) ×4 IMPLANT
SEAL CANN UNIV 5-8 DVNC XI (MISCELLANEOUS) ×9 IMPLANT
SEAL XI 5MM-8MM UNIVERSAL (MISCELLANEOUS) ×3
SET CYSTO W/LG BORE CLAMP LF (SET/KITS/TRAYS/PACK) ×4 IMPLANT
SET TUBE SMOKE EVAC HIGH FLOW (TUBING) ×4 IMPLANT
SPIKE FLUID TRANSFER (MISCELLANEOUS) ×4 IMPLANT
STAPLER CANNULA SEAL DVNC XI (STAPLE) ×3 IMPLANT
STAPLER CANNULA SEAL XI (STAPLE) ×1
STENT PERCUFLEX 4.8FRX26 (STENTS) ×1 IMPLANT
STENT URET 6FRX26 CONTOUR (STENTS) ×1 IMPLANT
SURGILUBE 2OZ TUBE FLIPTOP (MISCELLANEOUS) ×4 IMPLANT
SUT ETHILON 3-0 FS-10 30 BLK (SUTURE) ×4
SUT MNCRL AB 4-0 PS2 18 (SUTURE) ×4 IMPLANT
SUT VIC AB 3-0 SH 27 (SUTURE)
SUT VIC AB 3-0 SH 27X BRD (SUTURE) IMPLANT
SUT VICRYL 0 AB UR-6 (SUTURE) ×8 IMPLANT
SUTURE EHLN 3-0 FS-10 30 BLK (SUTURE) IMPLANT
SYR 50ML LL SCALE MARK (SYRINGE) ×1 IMPLANT
SYR TOOMEY IRRIG 70ML (MISCELLANEOUS)
SYRINGE TOOMEY IRRIG 70ML (MISCELLANEOUS) IMPLANT
SYS BAG RETRIEVAL 10MM (BASKET) ×4
SYSTEM BAG RETRIEVAL 10MM (BASKET) ×3 IMPLANT
WATER STERILE IRR 1000ML POUR (IV SOLUTION) ×4 IMPLANT
WATER STERILE IRR 500ML POUR (IV SOLUTION) ×8 IMPLANT

## 2022-06-17 NOTE — Op Note (Signed)
Date of procedure: 06/17/22  Preoperative diagnosis:  Left ureteral stone Acute cholecystitis  Postoperative diagnosis:  Same  Procedure: Cystoscopy, left retrograde pyelogram with intraoperative interpretation, left ureteral stent placement  Surgeon: Nickolas Madrid, MD  Anesthesia: General  Complications: None  Intraoperative findings:  Normal cystoscopy, hydronephrotic drip with wire placement Unable to pass a 6 French stent and ultimately downsized to a 4.8 [place] stent, excellent drainage through side ports of the stent after placement No evidence of purulence from left kidney  EBL: None for urology portion  Specimens: Left renal aspirate for urine culture  Drains: Left 4.8 French by 26 cm ureteral stent  Indication: Lori Banks is a 61 y.o. patient who presented with abdominal and back pain and was found to have both acute cholecystitis as well as a left proximal ureteral stone with benign urinalysis aside from microscopic hematuria.  Using shared decision making, in combination with general surgery she opted to proceed to the OR for laparoscopic cholecystectomy as well as left ureteral stent placement with plan for delayed ureteroscopy, laser lithotripsy, and stent change.  After reviewing the management options for treatment, they elected to proceed with the above surgical procedure(s). We have discussed the potential benefits and risks of the procedure, side effects of the proposed treatment, the likelihood of the patient achieving the goals of the procedure, and any potential problems that might occur during the procedure or recuperation. Informed consent has been obtained.  Description of procedure:  The patient had been taken to the OR by general surgery and anesthesia induced, preoperative antibiotics given, and SCDs placed.  She was repeat positioned, prepped and draped in standard sterile fashion in lithotomy.  A 21 French rigid cystoscope was used to  intubate the urethra and thorough cystoscopy was performed.  The bladder was grossly normal throughout, with ureteral orifices orthotopic bilaterally.  A access catheter was advanced into the left ureteral orifice and a retrograde pyelogram performed showing a large stone in the proximal ureter with moderate upstream hydronephrosis.  A sensor wire was advanced alongside the stone into the kidney under fluoroscopic vision.  The rigid cystoscope was backloaded over the wire and I attempted to pass a 6 [place] by 26 cm ureteral stent, but met significant resistance at the level of the stone and was unable to advance the stent past the stone.  At this point I removed the 6 French stent and advanced the access catheter back over the wire and a hydronephrotic drip was noted.  5 mL of amber urine were aspirated from the kidney and sent for culture.  A retrograde pyelogram was performed outlining moderate hydronephrosis in the collecting system, and the sensor wire was replaced.  I then backloaded the rigid cystoscope over the wire, and was able to pass a 4.8 [place] by 26 cm ureteral stent into the kidney under fluoroscopic vision.  Some resistance was met at the level of the stone, but under fluoroscopy there appeared to be an excellent curl in the renal pelvis proximal to the stone, and contrast and fluid drained through the side ports of the stent under direct vision.  There was an excellent curl in the bladder.  The bladder was drained and this concluded the procedure.  Disposition: Stable to PACU  Plan: Admit to hospitalist and continue antibiotics and resuscitation for acute cholecystitis Anticipate left ureteroscopy, laser lithotripsy, stent change in 2 to 4 weeks as outpatient  Nickolas Madrid, MD

## 2022-06-17 NOTE — Assessment & Plan Note (Addendum)
CT renal stone study with cholecystitis RUQ [place] with cholelithiasis with acute cholecystitis S/p robotic assisted cholecystectomy with ICG firefly cholangiogram 7/5 Continue abx with zosyn for now CLD Pain regimen, bowel regimen  Blood cx pending

## 2022-06-17 NOTE — H&P (Addendum)
[place]   PATIENT NAME: Lori Banks    MR#:  [id number]  DATE OF BIRTH:  Apr 24, 1961  DATE OF ADMISSION:  06/16/2022  PRIMARY CARE PHYSICIAN: Sherrie Mustache, MD   Patient is coming from: Home  REQUESTING/REFERRING PHYSICIAN: Shaune Pollack, MD  CHIEF COMPLAINT:   Chief Complaint  Patient presents with   Flank Pain    HISTORY OF PRESENT ILLNESS:  Lori Banks is a 61 y.o. female with medical history significant for history of COPD, rheumatoid arthritis on methotrexate and immunomodulator therapy and urolithiasis, presented to the ER with acute onset of right-sided upper quadrant abdominal pain as well as associated left flank pain with nausea and vomiting for a few times as well as diarrhea withNo bright red bleeding per rectum.  He denies any bilious vomitus or hematemesis.  No dyspnea or cough or wheezing.  No chest pain or palpitations.  No dysuria, oliguria or hematuria or flank pain.  ED Course: Upon presenting to the emergency room, vital signs were within normal and labs revealed blood glucose of 168 with otherwise unremarkable CMP CBC showed leukocytosis of 14.3 with neutrophilia and mild anemia.  Imaging: Two-view chest x-ray showed no acute cardiopulmonary disease. Abdominal pelvic CT scan showed the following 1. Findings consistent with acute cholecystitis. Correlation with right upper quadrant ultrasound is recommended. 2. 6 mm obstructing left ureteral calculus with subsequent hydronephrosis and hydroureter. 3. 4 mm nonobstructing left renal calculus. 4. Sigmoid diverticulosis. 5. Aortic atherosclerosis  The patient was given 4 mg of IV morphine sulfate twice, 4 g IV Zofran, 15 mg of IV Toradol, 1 L bolus of IV normal saline in IV Zosyn.  He will be admitted to a surgical telemetry bed for further evaluation and management. PAST MEDICAL HISTORY:   Past Medical History:  Diagnosis Date   COPD (chronic obstructive pulmonary disease) (HCC)     Kidney stone    Rheumatoid arthritis (HCC)     PAST SURGICAL HISTORY:   Past Surgical History:  Procedure Laterality Date   APPENDECTOMY     CESAREAN SECTION     MULTIPLE TOOTH EXTRACTIONS     all teeth have been removed, patient wears dentures    ORIF ANKLE FRACTURE Right 03/09/2022   Procedure: OPEN REDUCTION INTERNAL FIXATION (ORIF) ANKLE FRACTURE;  Surgeon: Lyndle Herrlich, MD;  Location: [facility] ORS;  Service: Orthopedics;  Laterality: Right;   TUBAL LIGATION      SOCIAL HISTORY:   Social History   Tobacco Use   Smoking status: Former    Types: Cigarettes    Quit date: 04/2019    Years since quitting: 3.1   Smokeless tobacco: Never  Substance Use Topics   Alcohol use: Yes    Comment: occassionally    FAMILY HISTORY:   Family History  Problem Relation Age of Onset   Diabetes Mother    Osteoarthritis Mother    Heart disease Father    Diabetes Sister    Fibromyalgia Sister    Meniere's disease Brother    Healthy Son    Healthy Son    Seizures Son    Healthy Daughter    Epilepsy Nephew     DRUG ALLERGIES:  No Known Allergies  REVIEW OF SYSTEMS:   ROS As per history of present illness. All pertinent systems were reviewed above. Constitutional, HEENT, cardiovascular, respiratory, GI, GU, musculoskeletal, neuro, psychiatric, endocrine, integumentary and hematologic systems were reviewed and are otherwise negative/unremarkable except for positive findings mentioned above in  the HPI.   MEDICATIONS AT HOME:   Prior to Admission medications   Medication Sig Start Date End Date Taking? Authorizing Provider  atorvastatin (LIPITOR) 10 MG tablet Take 10 mg by mouth daily. 06/14/22  Yes [provider]  ergocalciferol (VITAMIN D2) 1.25 MG (50000 UT) capsule Take 50,000 Units by mouth once a week.   Yes [provider]  folic acid (FOLVITE) 1 MG tablet Take 1 tablet (1 mg total) by mouth daily. 01/22/21  Yes Deveshwar, Janalyn Rouse, MD  methotrexate  (RHEUMATREX) 2.5 MG tablet Take 7.5mg  in the morning and with dinner one day per week. 12/24/20  Yes Christen Butter, NP  Multiple Vitamin (MULTIVITAMIN WITH MINERALS) TABS tablet Take 1 tablet by mouth daily.   Yes [provider]  XELJANZ 10 MG TABS TAKE 1/2 TABLET BY MOUTH IN THE MORNING AND AT BEDTIME 02/28/21  Yes Christen Butter, NP  diclofenac Sodium (VOLTAREN) 1 % GEL Apply topically 2 (two) times daily as needed (pain).    [provider]  docusate sodium (COLACE) 100 MG capsule Take 1 capsule (100 mg total) by mouth daily as needed. 03/09/22 03/09/23  Lyndle Herrlich, MD  oxyCODONE-acetaminophen (PERCOCET) 5-325 MG tablet Take 1 tablet by mouth every 4 (four) hours as needed for severe pain. 03/09/22 03/09/23  Lyndle Herrlich, MD  predniSONE (DELTASONE) 5 MG tablet Take 5 mg by mouth as directed. Take 4 tablets (20 mg total) by mouth once daily for 3 days, THEN 3 tablets (15 mg total) once daily for 3 days, THEN 2 tablets (10 mg total) once daily for 3 days, THEN 1 tablet (5 mg total) once daily for 3 days. Take in the mornings.. Patient not taking: Reported on 06/16/2022 06/09/22   [provider]      VITAL SIGNS:  Blood pressure 124/62, pulse 75, temperature 97.8 F (36.6 C), temperature source Oral, resp. rate 16, height 5\' 6"  (1.676 m), weight 78.5 kg, SpO2 98 %.  PHYSICAL EXAMINATION:  Physical Exam  GENERAL:  61 y.o.-year-old female patient lying in the bed with no acute distress.  EYES: Pupils equal, round, reactive to light and accommodation. No scleral icterus. Extraocular muscles intact.  HEENT: Head atraumatic, normocephalic. Oropharynx and nasopharynx clear.  NECK:  Supple, no jugular venous distention. No thyroid enlargement, no tenderness.  LUNGS: Normal breath sounds bilaterally, no wheezing, rales,rhonchi or crepitation. No use of accessory muscles of respiration.  CARDIOVASCULAR: Regular rate and rhythm, S1, S2 normal. No murmurs, rubs, or gallops.   ABDOMEN: Soft, nondistended, with right upper quadrant and left CVA tenderness without rebound tenderness guarding or rigidity.  Bowel sounds present. No organomegaly or mass.  EXTREMITIES: No pedal edema, cyanosis, or clubbing.  NEUROLOGIC: Cranial nerves II through XII are intact. Muscle strength 5/5 in all extremities. Sensation intact. Gait not checked.  PSYCHIATRIC: The patient is alert and oriented x 3.  Normal affect and good eye contact. SKIN: No obvious rash, lesion, or ulcer.   LABORATORY PANEL:   CBC Recent Labs  Lab 06/16/22 2119  WBC 14.3*  HGB 11.7*  HCT 35.9*  PLT 247   ------------------------------------------------------------------------------------------------------------------  Chemistries  Recent Labs  Lab 06/16/22 2119  NA 138  K 3.6  CL 105  CO2 24  GLUCOSE 168*  BUN 19  CREATININE 0.74  CALCIUM 9.7  AST 25  ALT 20  ALKPHOS 46  BILITOT 0.9   ------------------------------------------------------------------------------------------------------------------  Cardiac Enzymes No results for input(s): "TROPONINI" in the last 168 hours. ------------------------------------------------------------------------------------------------------------------  RADIOLOGY:  US Abdomen Limited RUQ (LIVER/GB)  Result Date: 06/16/2022 CLINICAL DATA:  [id number] EXAM: ULTRASOUND ABDOMEN LIMITED RIGHT UPPER QUADRANT COMPARISON:  CT renal 06/16/2022 FINDINGS: Gallbladder: Hydropic gallbladder with gallstones noted within the lumen. Gallbladder wall thickening and pericholecystic fluid noted. Positive sonographic Eulah Pont sign was reported by the ultrasound technician. Common bile duct: Diameter: 6 mm. Liver: There is a 2.8 x 2.2 x 3 cm simple appearing right hepatic cystic lesion. Otherwise no focal lesion identified. Within normal limits in parenchymal echogenicity. Portal vein is patent on color Doppler imaging with normal direction of blood flow towards the liver. Other:  None. IMPRESSION: Cholelithiasis with acute cholecystitis. Electronically Signed   By: Tish Frederickson M.D.   On: 06/16/2022 23:14   CT Renal Stone Study  Result Date: 06/16/2022 CLINICAL DATA:  Right flank pain. EXAM: CT ABDOMEN AND PELVIS WITHOUT CONTRAST TECHNIQUE: Multidetector CT imaging of the abdomen and pelvis was performed following the standard protocol without IV contrast. RADIATION DOSE REDUCTION: This exam was performed according to the departmental dose-optimization program which includes automated exposure control, adjustment of the mA and/or kV according to patient size and/or use of iterative reconstruction technique. COMPARISON:  None Available. FINDINGS: Lower chest: No acute abnormality. Hepatobiliary: No focal liver abnormality is seen. The gallbladder is moderately distended with a mild amount of pericholecystic inflammatory fat stranding. No gallstones are identified. There is no evidence of biliary dilatation. Pancreas: Unremarkable. No pancreatic ductal dilatation or surrounding inflammatory changes. Spleen: Normal in size without focal abnormality. Adrenals/Urinary Tract: Adrenal glands are unremarkable. Kidneys are normal in size without focal lesions. A 6 mm obstructing renal calculus is seen within the mid left ureter with mild to moderate severity left-sided hydronephrosis and hydroureter. A 4 mm nonobstructing renal calculus is seen within the lower pole of the left kidney. Bladder is unremarkable. Stomach/Bowel: Stomach is within normal limits. The appendix is surgically absent. No evidence of bowel wall thickening, distention, or inflammatory changes. Noninflamed diverticula are seen throughout the mid to distal sigmoid colon. Vascular/Lymphatic: Aortic atherosclerosis. No enlarged abdominal or pelvic lymph nodes. Reproductive: Uterus and bilateral adnexa are unremarkable. Other: No abdominal wall hernia or abnormality. No abdominopelvic ascites. Musculoskeletal: No acute or  significant osseous findings. IMPRESSION: 1. Findings consistent with acute cholecystitis. Correlation with right upper quadrant ultrasound is recommended. 2. 6 mm obstructing left ureteral calculus with subsequent hydronephrosis and hydroureter. 3. 4 mm nonobstructing left renal calculus. 4. Sigmoid diverticulosis. 5. Aortic atherosclerosis. Aortic Atherosclerosis (ICD10-I70.0). Electronically Signed   By: Aram Candela M.D.   On: 06/16/2022 22:05      IMPRESSION AND PLAN:  Assessment and Plan: * Acute cholecystitis - The patient will be admitted to a surgical telemetry bed. - Pain management will be provided. - We will place her on IV Zosyn. - General surgery consult will be obtained. - Dr. Aleen Campi was notified about the patient and is aware. - The patient be kept n.p.o. after midnight. - She has no history of coronary artery disease, diabetes mellitus on insulin, CAD, CVA or CHF.  She is considered at average risk for age for perioperative cardiovascular events per the revised cardiac risk index.  She has no current pulmonary issues.  Acute unilateral obstructive uropathy - Urology consult will be obtained for possible need of left ureteral stent placement.  Rheumatoid arthritis involving multiple sites [facility]) - The patient is on methotrexate and Xeljanz for her rheumatoid arthritis.  Dyslipidemia - We will continue statin therapy.  Chronic obstructive pulmonary disease (COPD) (HCC) -  No current exacerbation. - As needed DuoNebs will be provided.   DVT prophylaxis: Lovenox.  Advanced Care Planning:  Code Status: full code.  Family Communication:  The plan of care was discussed in details with the patient (and family). I answered all questions. The patient agreed to proceed with the above mentioned plan. Further management will depend upon hospital course. Disposition Plan: Back to previous home environment Consults called: General surgery and urology. All the records are  reviewed and case discussed with ED provider.  Status is: Inpatient  At the time of the admission, it appears that the appropriate admission status for this patient is inpatient.  This is judged to be reasonable and necessary in order to provide the required intensity of service to ensure the patient's safety given the presenting symptoms, physical exam findings and initial radiographic and laboratory data in the context of comorbid conditions.  The patient requires inpatient status due to high intensity of service, high risk of further deterioration and high frequency of surveillance required.  I certify that at the time of admission, it is my clinical judgment that the patient will require inpatient hospital care extending more than 2 midnights.                            Dispo: The patient is from: Home              Anticipated d/c is to: Home              Patient currently is not medically stable to d/c.              Difficult to place patient: No  Hannah Beat M.D on 06/17/2022 at 4:21 AM  [facility]   From 7 PM-7 AM, contact night-coverage [email or web address]  CC: Primary care physician; Sherrie Mustache, MD

## 2022-06-17 NOTE — Transfer of Care (Signed)
Immediate Anesthesia Transfer of Care Note  Patient: Lori Banks [facility]  Procedure(s) Performed: XI ROBOTIC ASSISTED LAPAROSCOPIC CHOLECYSTECTOMY (Abdomen) INDOCYANINE GREEN FLUORESCENCE IMAGING (ICG) (Bilateral) CYSTOSCOPY WITH STENT PLACEMENT (Left: Ureter)  Patient Location: PACU  Anesthesia Type:General  Level of Consciousness: drowsy  Airway & Oxygen Therapy: Patient Spontanous Breathing  Post-op Assessment: Report given to RN and Post -op Vital signs reviewed and stable  Post vital signs: Reviewed and stable  Last Vitals:  Vitals Value Taken Time  BP 107/48 06/17/22 1208  Temp    Pulse 87 06/17/22 1212  Resp 18 06/17/22 1212  SpO2 95 % 06/17/22 1212  Vitals shown include unvalidated device data.  Last Pain:  Vitals:   06/17/22 0842  TempSrc: Temporal  PainSc: 5          Complications: No notable events documented.

## 2022-06-17 NOTE — Anesthesia Procedure Notes (Signed)
Procedure Name: Intubation Date/Time: 06/17/2022 9:09 AM  Performed by: Lynden Oxford, CRNAPre-anesthesia Checklist: Patient identified, Emergency Drugs available, Suction available and Patient being monitored Patient Re-evaluated:Patient Re-evaluated prior to induction Oxygen Delivery Method: Circle system utilized Preoxygenation: Pre-oxygenation with 100% oxygen Induction Type: IV induction Ventilation: Mask ventilation without difficulty Laryngoscope Size: McGraph and 4 Grade View: Grade I Tube type: Oral Tube size: 7.0 mm Number of attempts: 1 Airway Equipment and Method: Stylet and Video-laryngoscopy Placement Confirmation: ETT inserted through vocal cords under direct vision, positive ETCO2 and breath sounds checked- equal and bilateral Secured at: 20 cm Tube secured with: Tape Dental Injury: Teeth and Oropharynx as per pre-operative assessment

## 2022-06-17 NOTE — ED Notes (Signed)
This RN to bedside, introduced self to patient. Pt's RA O2 noted to be 88-89%. Pt noted to be resting in bed with eyes closed, resp even and unlabored. Pt placed on 2L via [place] at this time.

## 2022-06-17 NOTE — Assessment & Plan Note (Signed)
-   No current exacerbation. - As needed DuoNebs will be provided.

## 2022-06-17 NOTE — Progress Notes (Signed)
Surgical Physician Order Form [facility]  * Scheduling expectation :  2 to 4 weeks from 06/17/2022  *Length of Case: 1 hour  *Clearance needed: no  *Anticoagulation Instructions: May continue all anticoagulants  *Aspirin Instructions: Ok to continue all  *Post-op visit Date/Instructions: TBD  *Diagnosis: Left Ureteral Stone  *Procedure: left  Ureteroscopy w/laser lithotripsy & stent exchange (31540)   Additional orders: N/A  -Admit type: OUTpatient  -Anesthesia: General  -VTE Prophylaxis Standing Order SCD's       Other:   -Standing Lab Orders Per Anesthesia    Lab other: UA&Urine Culture  -Standing Test orders EKG/Chest x-ray per Anesthesia       Test other:   - Medications:  Ancef 2gm IV  -Other orders:  N/A

## 2022-06-17 NOTE — Assessment & Plan Note (Addendum)
UA not particularly concerning for UTI Urine culture pending Does have blood, likely due to stone S/p cystoscopy, L retrograde pyelogram with intraoperative interpretation, L ureteral stent placement 7/5

## 2022-06-17 NOTE — Progress Notes (Signed)
PROGRESS NOTE    Lori Banks  YBW:[id number] DOB: [birth year] DOA: 06/16/2022 PCP: Sherrie Mustache, MD  Chief Complaint  Patient presents with   Flank Pain    Brief Narrative:  Lori Banks is Lori Banks 61 y.o. female with medical history significant for history of COPD, rheumatoid arthritis on methotrexate and xeljanz, nephrolithiasis presenting with cholecystitis and obstructing stone causing hydronephrosis and hydroureter.  S/p robotic cholecystectomy by gen surg and stent placement by urology.  See below for additional details     Assessment & Plan:   Principal Problem:   Acute cholecystitis Active Problems:   Acute unilateral obstructive uropathy   Rheumatoid arthritis involving multiple sites (HCC)   Chronic obstructive pulmonary disease (COPD) (HCC)   Dyslipidemia   Assessment and Plan: * Acute cholecystitis CT renal stone study with cholecystitis RUQ [place] with cholelithiasis with acute cholecystitis S/p robotic assisted cholecystectomy with ICG firefly cholangiogram 7/5 Continue abx with zosyn for now CLD Pain regimen, bowel regimen  Blood cx pending  Acute unilateral obstructive uropathy UA not particularly concerning for UTI Urine culture pending Does have blood, likely due to stone S/p cystoscopy, L retrograde pyelogram with intraoperative interpretation, L ureteral stent placement 7/5  Rheumatoid arthritis involving multiple sites (HCC) Hold methotrexate/xeljanz for now  Chronic obstructive pulmonary disease (COPD) (HCC) - No current exacerbation. - As needed DuoNebs will be provided.  Dyslipidemia - We will continue statin therapy.     DVT prophylaxis: scd Code Status: full Family Communication: none Disposition:   Status is: Inpatient Remains inpatient appropriate because: per surgery   Consultants:  Urology surgery  Procedures:  Cystoscopy, left retrograde pyelogram with intraoperative interpretation, left ureteral stent  placement  Procedure:  Robotic assisted cholecystectomy with ICG FireFly cholangiogram  Antimicrobials:  Anti-infectives (From admission, onward)    Start     Dose/Rate Route Frequency Ordered Stop   06/18/22 0600  piperacillin-tazobactam (ZOSYN) IVPB 3.375 g  Status:  Discontinued        3.375 g 12.5 mL/hr over 240 Minutes Intravenous Every 8 hours 06/17/22 0019 06/17/22 1437   06/17/22 1700  piperacillin-tazobactam (ZOSYN) IVPB 3.375 g        3.375 g 12.5 mL/hr over 240 Minutes Intravenous Every 8 hours 06/17/22 1437     06/17/22 0841  piperacillin-tazobactam (ZOSYN) 3.375 GM/50ML IVPB       Note to Pharmacy: Lori Banks S: cabinet override      06/17/22 0841 06/17/22 0924   06/16/22 2215  piperacillin-tazobactam (ZOSYN) IVPB 3.375 g        3.375 g 100 mL/hr over 30 Minutes Intravenous  Once 06/16/22 2211 06/17/22 0112       Subjective: No new complaints Sore after surgery  Objective: Vitals:   06/17/22 1245 06/17/22 1305 06/17/22 1406 06/17/22 1632  BP: (!) 107/54 (!) 108/59 111/67 103/62  Pulse: 77 69 63 62  Resp: 19 20 20 18   Temp: 98.2 F (36.8 C) 97.7 F (36.5 C) 97.6 F (36.4 C) 98 F (36.7 C)  TempSrc:  Oral Oral Oral  SpO2: 94% 93% 96% 96%  Weight:      Height:        Intake/Output Summary (Last 24 hours) at 06/17/2022 1647 Last data filed at 06/17/2022 1500 Gross per 24 hour  Intake 2050 ml  Output 805 ml  Net 1245 ml   Filed Weights   06/16/22 2109 06/17/22 0842  Weight: 78.5 kg 78.5 kg    Examination:  General exam: Appears calm and comfortable  Respiratory system: unlabored Cardiovascular system: RRR Gastrointestinal system: surgical sites well appearing, mildly distended, physical exam deferred post op Central nervous system: Alert and oriented. No focal neurological deficits. Extremities: no LEE   Data Reviewed: I have personally reviewed following labs and imaging studies  CBC: Recent Labs  Lab 06/16/22 2119 06/17/22 0451  WBC  14.3* 12.4*  NEUTROABS 13.0*  --   HGB 11.7* 11.8*  HCT 35.9* 36.6  MCV 92.3 93.4  PLT 247 232    Basic Metabolic Panel: Recent Labs  Lab 06/16/22 2119 06/17/22 0451  NA 138 140  K 3.6 4.0  CL 105 108  CO2 24 26  GLUCOSE 168* 139*  BUN 19 16  CREATININE 0.74 0.91  CALCIUM 9.7 9.4    GFR: Estimated Creatinine Clearance: 68.7 mL/min (by C-G formula based on SCr of 0.91 mg/dL).  Liver Function Tests: Recent Labs  Lab 06/16/22 2119 06/17/22 0451  AST 25 124*  ALT 20 98*  ALKPHOS 46 48  BILITOT 0.9 1.2  PROT 6.9 6.4*  ALBUMIN 4.1 3.5    CBG: No results for input(s): "GLUCAP" in the last 168 hours.   Recent Results (from the past 240 hour(s))  SARS Coronavirus 2 by RT PCR (hospital order, performed in [facility] hospital lab) *cepheid single result test* Anterior Nasal Swab     Status: None   Collection Time: 06/16/22 10:11 PM   Specimen: Anterior Nasal Swab  Result Value Ref Range Status   SARS Coronavirus 2 by RT PCR NEGATIVE NEGATIVE Final    Comment: (NOTE) SARS-CoV-2 target nucleic acids are NOT DETECTED.  The SARS-CoV-2 RNA is generally detectable in upper and lower respiratory specimens during the acute phase of infection. The lowest concentration of SARS-CoV-2 viral copies this assay can detect is 250 copies / mL. Lori Banks negative result does not preclude SARS-CoV-2 infection and should not be used as the sole basis for treatment or other patient management decisions.  Lori Banks negative result may occur with improper specimen collection / handling, submission of specimen other than nasopharyngeal swab, presence of viral mutation(s) within the areas targeted by this assay, and inadequate number of viral copies (<250 copies / mL). Lori Banks negative result must be combined with clinical observations, patient history, and epidemiological information.  Fact Sheet for Patients:   RoadLapTop.co.za  Fact Sheet for Healthcare  Providers: http://kim-miller.com/  This test is not yet approved or  cleared by the [place] FDA and has been authorized for detection and/or diagnosis of SARS-CoV-2 by FDA under an Emergency Use Authorization (EUA).  This EUA will remain in effect (meaning this test can be used) for the duration of the COVID-19 declaration under Section 564(b)(1) of the Act, 21 U.S.C. section 360bbb-3(b)(1), unless the authorization is terminated or revoked sooner.  Performed at [facility], [address] [place], [place] [address]          Radiology Studies: DG C-Arm 1-60 Min-No Report  Result Date: 06/17/2022 Fluoroscopy was utilized by the requesting physician.  No radiographic interpretation.   US Abdomen Limited RUQ (LIVER/GB)  Result Date: 06/16/2022 CLINICAL DATA:  [id number] EXAM: ULTRASOUND ABDOMEN LIMITED RIGHT UPPER QUADRANT COMPARISON:  CT renal 06/16/2022 FINDINGS: Gallbladder: Hydropic gallbladder with gallstones noted within the lumen. Gallbladder wall thickening and pericholecystic fluid noted. Positive sonographic Eulah Pont sign was reported by the ultrasound technician. Common bile duct: Diameter: 6 mm. Liver: There is Mylani Gentry 2.8 x 2.2 x 3 cm simple appearing right hepatic cystic lesion. Otherwise no focal lesion identified.  Within normal limits in parenchymal echogenicity. Portal vein is patent on color Doppler imaging with normal direction of blood flow towards the liver. Other: None. IMPRESSION: Cholelithiasis with acute cholecystitis. Electronically Signed   By: Tish Frederickson M.D.   On: 06/16/2022 23:14   CT Renal Stone Study  Result Date: 06/16/2022 CLINICAL DATA:  Right flank pain. EXAM: CT ABDOMEN AND PELVIS WITHOUT CONTRAST TECHNIQUE: Multidetector CT imaging of the abdomen and pelvis was performed following the standard protocol without IV contrast. RADIATION DOSE REDUCTION: This exam was performed according to the departmental dose-optimization  program which includes automated exposure control, adjustment of the mA and/or kV according to patient size and/or use of iterative reconstruction technique. COMPARISON:  None Available. FINDINGS: Lower chest: No acute abnormality. Hepatobiliary: No focal liver abnormality is seen. The gallbladder is moderately distended with Zidane Renner mild amount of pericholecystic inflammatory fat stranding. No gallstones are identified. There is no evidence of biliary dilatation. Pancreas: Unremarkable. No pancreatic ductal dilatation or surrounding inflammatory changes. Spleen: Normal in size without focal abnormality. Adrenals/Urinary Tract: Adrenal glands are unremarkable. Kidneys are normal in size without focal lesions. Mariel Lukins 6 mm obstructing renal calculus is seen within the mid left ureter with mild to moderate severity left-sided hydronephrosis and hydroureter. Cathaleen Korol 4 mm nonobstructing renal calculus is seen within the lower pole of the left kidney. Bladder is unremarkable. Stomach/Bowel: Stomach is within normal limits. The appendix is surgically absent. No evidence of bowel wall thickening, distention, or inflammatory changes. Noninflamed diverticula are seen throughout the mid to distal sigmoid colon. Vascular/Lymphatic: Aortic atherosclerosis. No enlarged abdominal or pelvic lymph nodes. Reproductive: Uterus and bilateral adnexa are unremarkable. Other: No abdominal wall hernia or abnormality. No abdominopelvic ascites. Musculoskeletal: No acute or significant osseous findings. IMPRESSION: 1. Findings consistent with acute cholecystitis. Correlation with right upper quadrant ultrasound is recommended. 2. 6 mm obstructing left ureteral calculus with subsequent hydronephrosis and hydroureter. 3. 4 mm nonobstructing left renal calculus. 4. Sigmoid diverticulosis. 5. Aortic atherosclerosis. Aortic Atherosclerosis (ICD10-I70.0). Electronically Signed   By: Aram Candela M.D.   On: 06/16/2022 22:05        Scheduled Meds:   atorvastatin  10 mg Oral Daily   folic acid  1 mg Oral Daily   multivitamin with minerals  1 tablet Oral Daily   oxyCODONE-acetaminophen       polyethylene glycol  17 g Oral Daily   [START ON 06/21/2022] Vitamin D (Ergocalciferol)  50,000 Units Oral Q Sun   Continuous Infusions:  0.9 % NaCl with KCl 20 mEq / L 100 mL/hr at 06/17/22 1632   piperacillin-tazobactam 3.375 g (06/17/22 1638)     LOS: 0 days    Time spent: over 30 min    Lacretia Nicks, MD [facility]   To contact the attending provider between 7A-7P or the covering provider during after hours 7P-7A, please log into the web site [email or web address] and access using universal [facility] password for that web site. If you do not have the password, please call the hospital operator.  06/17/2022, 4:47 PM

## 2022-06-17 NOTE — Op Note (Signed)
Procedure Date:  06/17/2022  Pre-operative Diagnosis:  Acute cholecystitis, left ureteral stone.  Post-operative Diagnosis: Acute gangrenous cholecystitis, left ureteral stone.  Procedure:  Robotic assisted cholecystectomy with ICG FireFly cholangiogram  Surgeon:  Howie Ill, MD  Anesthesia:  General endotracheal  Estimated Blood Loss:  15 ml  Specimens:  gallbladder  Complications:  None  Findings:  The patient had gangrenous cholecystitis with very edematous gallbladder wall.  The cystic duct was obstructed but ICG was able to visualize the common bile duct without difficulty.    Indications for Procedure:  This is a 61 y.o. female who presents with abdominal pain and workup revealing acute cholecystitis.  The benefits, complications, treatment options, and expected outcomes were discussed with the patient. The risks of bleeding, infection, recurrence of symptoms, failure to resolve symptoms, bile duct damage, bile duct leak, retained common bile duct stone, bowel injury, and need for further procedures were all discussed with the patient and she was willing to proceed.  Given her left ureteral stone, Dr. Richardo Hanks was also planning on placing a left ureteral stent.  Description of Procedure: The patient was correctly identified in the preoperative area and brought into the operating room.  The patient was placed supine with VTE prophylaxis in place.  Appropriate time-outs were performed.  Anesthesia was induced and the patient was intubated.  Appropriate antibiotics were infused.  The abdomen was prepped and draped in a sterile fashion. An infraumbilical incision was made. A cutdown technique was used to enter the abdominal cavity without injury, and a 12 mm robotic port was inserted.  Pneumoperitoneum was obtained with appropriate opening pressures.  Three 8-mm ports were placed in the mid abdomen at the level of the umbilicus under direct visualization.  The DaVinci platform was  docked, camera targeted, and instruments were placed under direct visualization.  The gallbladder was identified.  It was edematous, distended, and gangrenous.  There was some bilious fluid around the lower edge of her liver, perhaps from the gangrenous wall of the gallbladder.  The fundus was grasped and retracted cephalad.  Adhesions were lysed bluntly and with electrocautery. The infundibulum was grasped and retracted laterally, exposing the peritoneum overlying the gallbladder.  This was incised with electrocautery and extended on either side of the gallbladder.  FireFly cholangiogram was then obtained, and we were able to clearly identify the common bile duct.  The cystic duct was obstructed consistent with her diagnosis of cholecystitis.  The cystic duct and cystic artery were carefully dissected with combination of cautery and blunt dissection.  Both were clipped twice proximally and once distally, cutting in between.  The gallbladder was taken from the gallbladder fossa in a retrograde fashion with electrocautery. The gallbladder was placed in an Endocatch bag. The liver bed was inspected and any bleeding was controlled with electrocautery. The right upper quadrant was then inspected again revealing intact clips, no bleeding, and no ductal injury.  The area was thoroughly irrigated.  A 19 Fr. Blake drain was inserted via the right lateral port going to the RUQ and gallbladder fossa.  The 8 mm ports were removed under direct visualization and the 12 mm port was removed.  The Endocatch bag was brought out via the umbilical incision. The fascial opening was closed using 0 vicryl suture.  Local anesthetic was infused in all incisions and the incisions were closed with 4-0 Monocryl.  The drain was secured using 3-0 Nylon.  The wounds were cleaned and sealed with DermaBond.  The drain was  dressed with 4x4 gauze and TegaDerm.  The patient was then undraped and placed in lithotomy position for the second  portion of the case.  Please see Dr. Keane Scrape operative note for further details.  Once completed, the patient was emerged from anesthesia and extubated and brought to the recovery room for further management.  The patient tolerated the procedure well and all counts were correct at the end of the case.   Howie Ill, MD

## 2022-06-17 NOTE — Hospital Course (Signed)
Lori Banks is Lori Banks 61 y.o. female with medical history significant for history of COPD, rheumatoid arthritis on methotrexate and xeljanz, nephrolithiasis presenting with cholecystitis and obstructing stone causing hydronephrosis and hydroureter.  S/p robotic cholecystectomy by gen surg and stent placement by urology.  See below for additional details

## 2022-06-17 NOTE — Anesthesia Preprocedure Evaluation (Signed)
Anesthesia Evaluation  Patient identified by MRN, date of birth, ID band Patient awake    Reviewed: Allergy & Precautions, H&P , NPO status , Patient's Chart, lab work & pertinent test results, reviewed documented beta blocker date and time   History of Anesthesia Complications Negative for: history of anesthetic complications  Airway Mallampati: II  TM Distance: >3 FB Neck ROM: full    Dental  (+) Dental Advidsory Given, Edentulous Upper, Edentulous Lower   Pulmonary neg shortness of breath, neg sleep apnea, COPD,  COPD inhaler, neg recent URI, former smoker,    breath sounds clear to auscultation + decreased breath sounds      Cardiovascular Exercise Tolerance: Good negative cardio ROS Normal cardiovascular exam Rate:Normal  ekg noted. ja   Neuro/Psych negative neurological ROS  negative psych ROS   GI/Hepatic negative GI ROS, Neg liver ROS,   Endo/Other  negative endocrine ROS  Renal/GU Renal disease (kidney stones)  negative genitourinary   Musculoskeletal   Abdominal   Peds  Hematology negative hematology ROS (+)   Anesthesia Other Findings Past Medical History: No date: COPD (chronic obstructive pulmonary disease) (HCC) No date: Kidney stone No date: Rheumatoid arthritis (HCC)   Reproductive/Obstetrics negative OB ROS                             Anesthesia Physical  Anesthesia Plan  ASA: 2  Anesthesia Plan: General   Post-op Pain Management:    Induction: Intravenous  PONV Risk Score and Plan: 3 and Ondansetron, Dexamethasone, Midazolam and Treatment may vary due to age or medical condition  Airway Management Planned: Oral ETT  Additional Equipment:   Intra-op Plan:   Post-operative Plan: Extubation in OR  Informed Consent: I have reviewed the patients History and Physical, chart, labs and discussed the procedure including the risks, benefits and alternatives for  the proposed anesthesia with the patient or authorized representative who has indicated his/her understanding and acceptance.       Plan Discussed with: CRNA  Anesthesia Plan Comments:         Anesthesia Quick Evaluation

## 2022-06-17 NOTE — Consult Note (Signed)
Urology Consult   I have been asked to see the patient by Dr. Sidney Ace, for evaluation and management of abdominal pain, left proximal ureteral stone in the setting of acute cholecystitis.  Chief Complaint: Abdominal pain, back pain, nausea   HPI:  Lori Banks is a 62 y.o. year old female with history notable for rheumatoid arthritis who presents with 24 hours of severe upper abdominal and upper back pain bilaterally.  Work-up in the ED with CT and right upper quadrant ultrasound was consistent with acute cholecystitis in addition to a 7 mm left proximal ureteral stone.  Urinalysis was benign except for microscopic hematuria.  She denies any fevers but reports chills at home.  Denies any urinary symptoms or gross hematuria.  There are no aggravating or alleviating factors.  Pain is severe at 10/10 yesterday, but currently more comfortable.  PMH: Past Medical History:  Diagnosis Date   COPD (chronic obstructive pulmonary disease) (HCC)    Kidney stone    Rheumatoid arthritis (HCC)     Surgical History: Past Surgical History:  Procedure Laterality Date   APPENDECTOMY     CESAREAN SECTION     MULTIPLE TOOTH EXTRACTIONS     all teeth have been removed, patient wears dentures    ORIF ANKLE FRACTURE Right 03/09/2022   Procedure: OPEN REDUCTION INTERNAL FIXATION (ORIF) ANKLE FRACTURE;  Surgeon: Lyndle Herrlich, MD;  Location: [facility] ORS;  Service: Orthopedics;  Laterality: Right;   TUBAL LIGATION      Allergies: No Known Allergies  Family History: Family History  Problem Relation Age of Onset   Diabetes Mother    Osteoarthritis Mother    Heart disease Father    Diabetes Sister    Fibromyalgia Sister    Meniere's disease Brother    Healthy Son    Healthy Son    Seizures Son    Healthy Daughter    Epilepsy Nephew     Social History:  reports that she quit smoking about 3 years ago. Her smoking use included cigarettes. She has never used smokeless tobacco. She reports  current alcohol use. She reports current drug use. Drug: Marijuana.  ROS: Negative aside from those stated in the HPI.  Physical Exam: BP 128/65   Pulse 72   Temp 97.8 F (36.6 C) (Oral)   Resp 14   Ht 5\' 6"  (1.676 m)   Wt 78.5 kg   SpO2 98%   BMI 27.92 kg/m    Constitutional: Alert and conversational, uncomfortable appearing Cardiovascular: Regular rate and rhythm Respiratory: Clear to auscultation bilaterally GI: Abdomen is soft, nontender, nondistended, no abdominal masses GU: Bilateral CVA tenderness Lymph: No cervical or inguinal lymphadenopathy. Skin: No rashes, bruises or suspicious lesions. Neurologic: Grossly intact, no focal deficits, moving all 4 extremities. Psychiatric: Normal mood and affect.  Laboratory Data: Reviewed in epic Normal renal function with creatinine 0.74, leukocytosis to 14 K, urinalysis 6-10 RBCs, no bacteria, 0-5 WBCs, no leukocytes, nitrite negative  Pertinent Imaging: I have personally reviewed the CT showing a 7 mm left proximal ureteral stone with up stream hydronephrosis as well as a smaller left lower pole stone, in addition to acute cholecystitis confirmed with right upper quadrant ultrasound.  Assessment & Plan:   61 year old female who presents to the ER with 1 day of severe upper abdominal and bilateral upper back pain.  Work-up shows both acute cholecystitis as well as a 7 mm left proximal ureteral stone with hydronephrosis, and urinalysis appears non-infected.  I  discussed her case at length with Dr. Aleen Campi with general surgery who is planning for robotic cholecystectomy this morning.  I discussed options with the patient at length.  We discussed that if the acute cholecystitis was not present options would be a trial of medical expulsive therapy with spontaneous passage rate of approximately 15%, shockwave lithotripsy, or ureteroscopy, laser lithotripsy, and stent placement.  In the setting of acute cholecystitis with concern for  infection as well as her immunosuppression with methotrexate, using shared decision making I recommended left ureteral stent placement to relieve any left-sided renal colic and plan for definitive management with left ureteroscopy, laser lithotripsy, and stent change in 2 to 3 weeks after course of antibiotics for acute cholecystitis.  We specifically discussed the risks cystoscopy and ureteral stent placement including bleeding, infection/sepsis, stent related symptoms including flank pain/urgency/frequency/incontinence/dysuria, ureteral injury, inability to access stone, and need for staged procedure with definitive removal of left-sided kidney stone in 2 to 3 weeks after treatment of infection   Recommendations: -OR today in conjunction with general surgery-General surgery will perform robotic cholecystectomy and we will place a left ureteral stent for decompression of the left kidney, and she will need follow-up in 2 to 3 weeks for outpatient left ureteroscopy, laser lithotripsy, stent change for definitive removal of her left-sided stone  Sondra Come, MD  Total time spent on the floor was 60 minutes, with greater than 50% spent in counseling and coordination of care with the patient regarding left 7 mm proximal ureteral stone in the setting of acute cholecystitis, with plan for left ureteral stent placement in conjunction with laparoscopic cholecystectomy by general surgery.  [facility] [address] [place], [place] [address] [phone]

## 2022-06-17 NOTE — Assessment & Plan Note (Addendum)
Hold methotrexate/xeljanz for now

## 2022-06-17 NOTE — Consult Note (Signed)
[facility] SURGICAL CONSULTATION NOTE (initial) - cpt: 99244   HISTORY OF PRESENT ILLNESS (HPI):  61 y.o. female presented to [facility] ED overnight for evaluation of abdominal pain. Patient reports the acute onset of right sided flank pain which started yesterday morning. This seemed to radiate across her abdomen. This was mild at first but became 10/10. Associated nausea. No fever, chills, cough, CP, SOB, emesis, bowel changes, or urinary changes. No history of similar. Previous intra-abdominal surgeries include appendectomy, C-section, and tubal ligation. Of note, she has a history of RA on Xeljanz and Methotrexate. Work up in the ED revealed a leukocytosis to 14.3K (now 12.4K), renal function normal with sCr - 0.91, LFTs normal, bilirubin normal at 0.9. CT Abdomen/Pelvis was obtained and concerning for acute cholecystitis and a 6 mm left proximal ureteral calculus with obstruction. RUQ was obtained and confirmed cholecystitis. She was admitted to the medicine service.   Surgery is consulted by emergency medicine physician Dr. Augusto Gamble, MD in this context for evaluation and management of cholecystitis.  PAST MEDICAL HISTORY (PMH):  Past Medical History:  Diagnosis Date   COPD (chronic obstructive pulmonary disease) (HCC)    Kidney stone    Rheumatoid arthritis (HCC)      PAST SURGICAL HISTORY (PSH):  Past Surgical History:  Procedure Laterality Date   APPENDECTOMY     CESAREAN SECTION     MULTIPLE TOOTH EXTRACTIONS     all teeth have been removed, patient wears dentures    ORIF ANKLE FRACTURE Right 03/09/2022   Procedure: OPEN REDUCTION INTERNAL FIXATION (ORIF) ANKLE FRACTURE;  Surgeon: Lyndle Herrlich, MD;  Location: [facility] ORS;  Service: Orthopedics;  Laterality: Right;   TUBAL LIGATION       MEDICATIONS:  Prior to Admission medications   Medication Sig Start Date End Date Taking? Authorizing Provider  atorvastatin (LIPITOR) 10 MG tablet Take 10 mg by mouth daily.  06/14/22  Yes [provider]  ergocalciferol (VITAMIN D2) 1.25 MG (50000 UT) capsule Take 50,000 Units by mouth once a week.   Yes [provider]  folic acid (FOLVITE) 1 MG tablet Take 1 tablet (1 mg total) by mouth daily. 01/22/21  Yes Deveshwar, Janalyn Rouse, MD  methotrexate (RHEUMATREX) 2.5 MG tablet Take 7.5mg  in the morning and with dinner one day per week. 12/24/20  Yes Christen Butter, NP  Multiple Vitamin (MULTIVITAMIN WITH MINERALS) TABS tablet Take 1 tablet by mouth daily.   Yes [provider]  XELJANZ 10 MG TABS TAKE 1/2 TABLET BY MOUTH IN THE MORNING AND AT BEDTIME 02/28/21  Yes Christen Butter, NP  diclofenac Sodium (VOLTAREN) 1 % GEL Apply topically 2 (two) times daily as needed (pain).    [provider]  docusate sodium (COLACE) 100 MG capsule Take 1 capsule (100 mg total) by mouth daily as needed. 03/09/22 03/09/23  Lyndle Herrlich, MD  oxyCODONE-acetaminophen (PERCOCET) 5-325 MG tablet Take 1 tablet by mouth every 4 (four) hours as needed for severe pain. 03/09/22 03/09/23  Lyndle Herrlich, MD  predniSONE (DELTASONE) 5 MG tablet Take 5 mg by mouth as directed. Take 4 tablets (20 mg total) by mouth once daily for 3 days, THEN 3 tablets (15 mg total) once daily for 3 days, THEN 2 tablets (10 mg total) once daily for 3 days, THEN 1 tablet (5 mg total) once daily for 3 days. Take in the mornings.. Patient not taking: Reported on 06/16/2022 06/09/22   [provider]     ALLERGIES:  No Known Allergies   SOCIAL HISTORY:  Social History   Socioeconomic History   Marital status: Legally Separated    Spouse name: Not on file   Number of children: Not on file   Years of education: Not on file   Highest education level: Not on file  Occupational History   Not on file  Tobacco Use   Smoking status: Former    Types: Cigarettes    Quit date: 04/2019    Years since quitting: 3.1   Smokeless tobacco: Never  Vaping Use   Vaping Use: Never used  Substance and  Sexual Activity   Alcohol use: Yes    Comment: occassionally   Drug use: Yes    Types: Marijuana    Comment: 5 days weekly    Sexual activity: Not Currently  Other Topics Concern   Not on file  Social History Narrative   Not on file   Social Determinants of Health   Financial Resource Strain: Not on file  Food Insecurity: Not on file  Transportation Needs: Not on file  Physical Activity: Not on file  Stress: Not on file  Social Connections: Not on file  Intimate Partner Violence: Not on file     FAMILY HISTORY:  Family History  Problem Relation Age of Onset   Diabetes Mother    Osteoarthritis Mother    Heart disease Father    Diabetes Sister    Fibromyalgia Sister    Meniere's disease Brother    Healthy Son    Healthy Son    Seizures Son    Healthy Daughter    Epilepsy Nephew       REVIEW OF SYSTEMS:  Review of Systems  Constitutional:  Negative for chills and fever.  HENT:  Negative for congestion and sore throat.   Respiratory:  Negative for cough and shortness of breath.   Cardiovascular:  Negative for chest pain and palpitations.  Gastrointestinal:  Positive for abdominal pain and nausea. Negative for constipation, diarrhea and vomiting.  Genitourinary:  Negative for dysuria, hematuria and urgency.  All other systems reviewed and are negative.   VITAL SIGNS:  Temp:  [97.8 F (36.6 C)] 97.8 F (36.6 C) (07/04 2114) Pulse Rate:  [65-75] 72 (07/05 0700) Resp:  [14-18] 14 (07/05 0700) BP: (114-137)/(60-75) 128/65 (07/05 0700) SpO2:  [94 %-100 %] 98 % (07/05 0700) Weight:  [78.5 kg] 78.5 kg (07/04 2109)     Height: 5\' 6"  (167.6 cm) Weight: 78.5 kg BMI (Calculated): 27.94   INTAKE/OUTPUT:  07/04 0701 - 07/05 0700 In: 1000 [IV Piggyback:1000] Out: -   PHYSICAL EXAM:  Physical Exam Vitals and nursing note reviewed. Exam conducted with a chaperone present.  Constitutional:      General: She is not in acute distress.    Appearance: Normal appearance.  She is not ill-appearing.     Comments: Patient resting in bed; NAD  HENT:     Head: Normocephalic and atraumatic.  Eyes:     General: No scleral icterus.    Conjunctiva/sclera: Conjunctivae normal.  Cardiovascular:     Rate and Rhythm: Normal rate.     Pulses: Normal pulses.     Heart sounds: No murmur heard. Pulmonary:     Effort: Pulmonary effort is normal. No respiratory distress.  Abdominal:     General: A surgical scar is present. There is no distension.     Palpations: Abdomen is soft.     Tenderness: There is abdominal tenderness in the right upper  quadrant and epigastric area. There is no guarding or rebound.     Hernia: No hernia is present.  Genitourinary:    Comments: Deferred Musculoskeletal:     Right lower leg: No edema.     Left lower leg: No edema.  Skin:    General: Skin is warm and dry.  Neurological:     General: No focal deficit present.     Mental Status: She is alert and oriented to person, place, and time.  Psychiatric:        Mood and Affect: Mood normal.        Behavior: Behavior normal.      Labs:     Latest Ref Rng & Units 06/17/2022    4:51 AM 06/16/2022    9:19 PM 01/22/2021    9:02 AM  CBC  WBC 4.0 - 10.5 K/uL 12.4  14.3  5.6   Hemoglobin 12.0 - 15.0 g/dL 16.1  09.6  04.5   Hematocrit 36.0 - 46.0 % 36.6  35.9  40.7   Platelets 150 - 400 K/uL 232  247  230       Latest Ref Rng & Units 06/17/2022    4:51 AM 06/16/2022    9:19 PM 01/22/2021    9:02 AM  CMP  Glucose 70 - 99 mg/dL 409  811  88   BUN 8 - 23 mg/dL Creatinine 0.44 - 1.00 mg/dL 9.14  7.82  9.56   Sodium 135 - 145 mmol/L 140  138  141   Potassium 3.5 - 5.1 mmol/L 4.0  3.6  4.4   Chloride 98 - 111 mmol/L 108  105  106   CO2 22 - 32 mmol/L Calcium 8.9 - 10.3 mg/dL 9.4  9.7  21.3   Total Protein 6.5 - 8.1 g/dL 6.4  6.9  6.5    6.7   Total Bilirubin 0.3 - 1.2 mg/dL 1.2  0.9  0.5   Alkaline Phos 38 - 126 U/L 48  46    AST 15 - 41 U/L 124  25  17   ALT 0 -  44 U/L 98  20  19       Imaging studies:   CT Abdomen/Pelvis (06/16/2022) personally reviewed which does show acute cholecystitis with cholelithiasis, 6 mm left ureteral stone appreciable, and radiologist report reviewed below:  IMPRESSION: 1. Findings consistent with acute cholecystitis. Correlation with right upper quadrant ultrasound is recommended. 2. 6 mm obstructing left ureteral calculus with subsequent hydronephrosis and hydroureter. 3. 4 mm nonobstructing left renal calculus. 4. Sigmoid diverticulosis. 5. Aortic atherosclerosis.  RUQ [place] (06/16/2022) personally reviewed which does again show cholelithiasis and changes consistent with cholecystitis, and radiologist report reviewed below:  IMPRESSION: Cholelithiasis with acute cholecystitis.   Assessment/Plan: (ICD-10's: K81.0) 61 y.o. female with abdominal pain and leukocytosis found to have acute cholecystitis as well as proximal left ureteral stone with obstruction, complicated by pertinent comorbidities including RA.   - Appreciate medicine admission   - Plan for robotic assisted laparoscopic cholecystectomy this morning with Dr Aleen Campi pending OR/anesthesia availability   - All risks, benefits, and alternatives to above procedure(s) were discussed with the patient, all of her questions were answered to her expressed satisfaction, patient expresses she wishes to proceed, and informed consent was obtained.    - Urology on board; planned for stenting this morning at time of cholecystectomy  - NPO this morning; IVF   -  IV Abx (Zosyn)  - Monitor abdominal examination  - Pain control prn; antiemetics prn  - DVT prophylaxis; hold for OR  All of the above findings and recommendations were discussed with the patient, and all of patient's questions were answered to her expressed satisfaction.  Thank you for the opportunity to participate in this patient's care.   -- Lynden Oxford, PA-C [facility] 06/17/2022, 7:10 AM M-F: 7am - 4pm

## 2022-06-17 NOTE — Progress Notes (Signed)
06/16/22  Case discussed with Dr. Erma Heritage.  Patient presents with 1 day history of right flank pain associated with nausea, no emesis.  Has hx of RA and is on Xeljanz and methotrexate.  CT scan showed a left ureteral stone with hydronephrosis, as well as a distended gallbladder with some inflammatory changes.  U/S confirmed cholelithiasis with gallbladder wall thickening and pericholecystic fluid consistent with cholecystitis.  Patient to be admitted to medical team.  Would be able to take her to OR for cholecystectomy in AM, but would be helpful to coordinate with Urology about possible combined case given her left ureteral stone.  Full consult note to follow in AM.  Henrene Dodge, MD

## 2022-06-17 NOTE — Discharge Instructions (Addendum)
In addition to included general post-operative instructions,  Diet: Resume home diet. Recommend avoiding or limiting fatty/greasy foods over the next few days/week. If you do eat these, you may (or may not) notice diarrhea. This is expected while your body adjusts to not having a gallbladder, and it typically resolves with time.    Activity: No heavy lifting >20 pounds (children, pets, laundry, garbage) for 4 weeks, but light activity and walking are encouraged. Do not drive or drink alcohol if taking narcotic pain medications or having pain that might distract from driving.  Drain: Monitor and record drain output daily; hand outs given   Wound care: If you can keep drain site water proofed, you may shower/get incision wet with soapy water and pat dry (do not rub incisions), but no baths or submerging incision underwater until follow-up.   Medications: Do not resume Methotrexate or  Lori Banks. Otherwise resume all home medications. For mild to moderate pain: acetaminophen (Tylenol) or ibuprofen/naproxen (if no kidney disease). Combining Tylenol with alcohol can substantially increase your risk of causing liver disease. Narcotic pain medications, if prescribed, can be used for severe pain, though may cause nausea, constipation, and drowsiness. Do not combine Tylenol and Percocet (or similar) within a 6 hour period as Percocet (and similar) contain(s) Tylenol. If you do not need the narcotic pain medication, you do not need to fill the prescription.  Call office [phone] / [phone]) at any time if any questions, worsening pain, fevers/chills, bleeding, drainage from incision site, or other concerns.      AMBULATORY SURGERY  DISCHARGE INSTRUCTIONS   The drugs that you were given will stay in your system until tomorrow so for the next 24 hours you should not:  Drive an automobile Make any legal decisions Drink any alcoholic beverage   You may resume regular meals tomorrow.  Today it  is better to start with liquids and gradually work up to solid foods.  You may eat anything you prefer, but it is better to start with liquids, then soup and crackers, and gradually work up to solid foods.   Please notify your doctor immediately if you have any unusual bleeding, trouble breathing, redness and pain at the surgery site, drainage, fever, or pain not relieved by medication.    Information for Discharge Teaching:   DO NOT REMOVE TEAL EXPAREL BRACELET for 4 days, June 21, 2022 (96 hours)  EXPAREL (bupivacaine liposome injectable suspension)   Your surgeon or anesthesiologist gave you EXPAREL(bupivacaine) to help control your pain after surgery.  EXPAREL is a local anesthetic that provides pain relief by numbing the tissue around the surgical site. EXPAREL is designed to release pain medication over time and can control pain for up to 72 hours. Depending on how you respond to EXPAREL, you may require less pain medication during your recovery.  Possible side effects: Temporary loss of sensation or ability to move in the area where bupivacaine was injected. Nausea, vomiting, constipation Rarely, numbness and tingling in your mouth or lips, lightheadedness, or anxiety may occur. Call your doctor right away if you think you may be experiencing any of these sensations, or if you have other questions regarding possible side effects.  Follow all other discharge instructions given to you by your surgeon or nurse. Eat a healthy diet and drink plenty of water or other fluids.  If you return to the hospital for any reason within 96 hours following the administration of EXPAREL, it is important for health care providers to know that  you have received this anesthetic. A teal colored band has been placed on your arm with the date, time and amount of EXPAREL you have received in order to alert and inform your health care providers. Please leave this armband in place for the full 96 hours following  administration, and then you may remove the band.         Please contact your physician with any problems or Same Day Surgery at [phone], Monday through Friday 6 am to 4 pm, or [place] at [facility] number at [phone].

## 2022-06-17 NOTE — Progress Notes (Signed)
Educated pt on the plan for a bowel regimen. Pt vocalized she was not comfortable taking the miralax tonight d/t several bowel moments yesterday. She will consider taking it tomorrow.

## 2022-06-17 NOTE — Assessment & Plan Note (Signed)
-   We will continue statin therapy. 

## 2022-06-18 DIAGNOSIS — N201 Calculus of ureter: Secondary | ICD-10-CM

## 2022-06-18 LAB — COMPREHENSIVE METABOLIC PANEL
ALT: 78 U/L — ABNORMAL HIGH (ref 0–44)
AST: 43 U/L — ABNORMAL HIGH (ref 15–41)
Albumin: 2.8 g/dL — ABNORMAL LOW (ref 3.5–5.0)
Alkaline Phosphatase: 36 U/L — ABNORMAL LOW (ref 38–126)
Anion gap: 3 — ABNORMAL LOW (ref 5–15)
BUN: 17 mg/dL (ref 8–23)
CO2: 26 mmol/L (ref 22–32)
Calcium: 9 mg/dL (ref 8.9–10.3)
Chloride: 111 mmol/L (ref 98–111)
Creatinine, Ser: 0.95 mg/dL (ref 0.44–1.00)
GFR, Estimated: >60 mL/min (ref >60)
Glucose, Bld: 168 mg/dL — ABNORMAL HIGH (ref 70–99)
Potassium: 4.4 mmol/L (ref 3.5–5.1)
Sodium: 140 mmol/L (ref 135–145)
Total Bilirubin: 0.7 mg/dL (ref 0.3–1.2)
Total Protein: 5 g/dL — ABNORMAL LOW (ref 6.5–8.1)

## 2022-06-18 LAB — URINE CULTURE: Culture: NO GROWTH

## 2022-06-18 LAB — CBC
HCT: 30.5 % — ABNORMAL LOW (ref 36.0–46.0)
Hemoglobin: 9.9 g/dL — ABNORMAL LOW (ref 12.0–15.0)
MCH: 30.3 pg (ref 26.0–34.0)
MCHC: 32.5 g/dL (ref 30.0–36.0)
MCV: 93.3 fL (ref 80.0–100.0)
Platelets: 172 10*3/uL (ref 150–400)
RBC: 3.27 MIL/uL — ABNORMAL LOW (ref 3.87–5.11)
RDW: 15 % (ref 11.5–15.5)
WBC: 10.8 10*3/uL — ABNORMAL HIGH (ref 4.0–10.5)
nRBC: 0 % (ref 0.0–0.2)

## 2022-06-18 LAB — SURGICAL PATHOLOGY

## 2022-06-18 MED ORDER — PREDNISONE 10 MG PO TABS
10.0000 mg | ORAL_TABLET | Freq: Every day | ORAL | Status: DC
Start: 2022-06-18 — End: 2022-06-19
  Administered 2022-06-18 – 2022-06-19 (×2): 10 mg via ORAL
  Filled 2022-06-18 (×2): qty 1

## 2022-06-18 MED ORDER — SODIUM CHLORIDE 0.9 % IV SOLN: INTRAVENOUS | Status: DC | PRN

## 2022-06-18 NOTE — Progress Notes (Signed)
PROGRESS NOTE    Lori Banks  NOB:[id number] DOB: 1960/12/20 DOA: 06/16/2022 PCP: Sherrie Mustache, MD  Chief Complaint  Patient presents with   Flank Pain    Brief Narrative:  Lori Banks is a 61 y.o. female with past medical history of COPD, rheumatoid arthritis on methotrexate and xeljanz, nephrolithiasis presented to hospital with acute right-sided upper abdominal pain as well as left flank pain with nausea and vomiting.  In the ED vitals were stable.  Had significant leukocytosis at 14.3.  X-ray of the chest was unremarkable but abdominal CT scan showed acute cholecystitis with 6 mm obstructing left ureteral calculus with hydronephrosis and hydroureter.  Patient was then admitted hospital for further evaluation and treatment.  During hospitalization, patient was seen by general surgery and underwent robotic assisted cholecystectomy on 06/17/2022 and ureteral stent placement on 06/17/2022.  Currently on IV antibiotics.  Plan is to continue IV antibiotic for 24 hours more.  Tentative discharge home on 06/19/2022  See below for additional details,  Assessment & Plan:   Principal Problem:   Acute cholecystitis Active Problems:   Acute unilateral obstructive uropathy   Rheumatoid arthritis involving multiple sites [facility])   Chronic obstructive pulmonary disease (COPD) (HCC)   Dyslipidemia   Assessment and Plan: * Acute cholecystitis Acute gangrenous cholecystitis.  Status post robotic assisted cholecystectomy on 06/17/2022.  General surgery on board.  Drain still in place.  Seen by general surgery.  Tentative plan for disposition home on 06/19/2022 with oral Augmentin-total 14-day course of antibiotic.Marland Kitchen  Recommendation is to continue IV antibiotic for next 24 hours.  Patient has been continued on regular diet.  Acute left obstructive uropathy Initial urine culture was negative.  Repeat urine culture was sent yesterday which is pending.  Patient is already on Zosyn.  Patient has  undergone  cystoscopy, L retrograde pyelogram with L ureteral stent placement on 06/17/22.  Urology has seen the patient today and plans for stent change in 2 to 4 weeks as outpatient with left ureteroscopy laser lithotripsy.  Rheumatoid arthritis involving multiple sites [facility]) Hold methotrexate/xeljanz for now.  General surgery recommends holding both for 7 days and restart 06/24/2022.  Chronic obstructive pulmonary disease (COPD) (HCC) Continue DuoNebs.  Currently compensated.  Dyslipidemia Continue statins  DVT prophylaxis: scd  Code Status: full  Family Communication: none  Disposition: Home likely 06/19/2022  Status is: Inpatient Remains inpatient appropriate because: Status post gangrenous cholecystitis surgery, IV antibiotic,   Consultants:  Urology General surgery  Procedures:  Cystoscopy, left retrograde pyelogram with intraoperative interpretation, left ureteral stent placement on 06/17/2022  Robotic assisted cholecystectomy with cholangiogram on 06/17/2022  Antimicrobials:  Anti-infectives (From admission, onward)    Start     Dose/Rate Route Frequency Ordered Stop   06/18/22 0600  piperacillin-tazobactam (ZOSYN) IVPB 3.375 g  Status:  Discontinued        3.375 g 12.5 mL/hr over 240 Minutes Intravenous Every 8 hours 06/17/22 0019 06/17/22 1437   06/17/22 1700  piperacillin-tazobactam (ZOSYN) IVPB 3.375 g        3.375 g 12.5 mL/hr over 240 Minutes Intravenous Every 8 hours 06/17/22 1437     06/17/22 0841  piperacillin-tazobactam (ZOSYN) 3.375 GM/50ML IVPB       Note to Pharmacy: Desma Paganini S: cabinet override      06/17/22 0841 06/17/22 0924   06/16/22 2215  piperacillin-tazobactam (ZOSYN) IVPB 3.375 g        3.375 g 100 mL/hr over 30 Minutes Intravenous  Once 06/16/22 2211  06/17/22 0112      Subjective: Today, patient was seen and examined at bedside.  Patient feels okay but needed oxycodone this morning for pain.  Denies any nausea vomiting fever  chills  Objective: Vitals:   06/18/22 0358 06/18/22 0853 06/18/22 0946 06/18/22 1204  BP: (!) 102/56 123/63  (!) 100/55  Pulse: (!) 55 (!) 54  61  Resp: Temp: 97.7 F (36.5 C)  98.7 F (37.1 C) 98.1 F (36.7 C)  TempSrc: Oral  Oral   SpO2: 97% 96%    Weight:      Height:        Intake/Output Summary (Last 24 hours) at 06/18/2022 1253 Last data filed at 06/18/2022 0810 Gross per 24 hour  Intake --  Output 2105 ml  Net -2105 ml    Filed Weights   06/16/22 2109 06/17/22 0842  Weight: 78.5 kg 78.5 kg    Physical examination:  General:  Average built, not in obvious distress HENT:   No scleral pallor or icterus noted. Oral mucosa is moist.  Chest:  Clear breath sounds.  Diminished breath sounds bilaterally. No crackles or wheezes.  CVS: S1 &S2 heard. No murmur.  Regular rate and rhythm. Abdomen: Soft, nontender, nondistended.  Bowel sounds are heard.  Laparoscopic site healthy. Extremities: No cyanosis, clubbing or edema.  Peripheral pulses are palpable. Psych: Alert, awake and oriented, normal mood CNS:  No cranial nerve deficits.  Power equal in all extremities.   Skin: Warm and dry.  No rashes noted.  Data Reviewed: I have personally reviewed following labs and imaging studies  CBC: Recent Labs  Lab 06/16/22 2119 06/17/22 0451 06/18/22 0437  WBC 14.3* 12.4* 10.8*  NEUTROABS 13.0*  --   --   HGB 11.7* 11.8* 9.9*  HCT 35.9* 36.6 30.5*  MCV 92.3 93.4 93.3  PLT 247 232 172     Basic Metabolic Panel: Recent Labs  Lab 06/16/22 2119 06/17/22 0451 06/18/22 0437  NA 138 140 140  K 3.6 4.0 4.4  CL 105 108 111  CO2 GLUCOSE 168* 139* 168*  BUN CREATININE 0.74 0.91 0.95  CALCIUM 9.7 9.4 9.0     GFR: Estimated Creatinine Clearance: 65.8 mL/min (by C-G formula based on SCr of 0.95 mg/dL).  Liver Function Tests: Recent Labs  Lab 06/16/22 2119 06/17/22 0451 06/18/22 0437  AST 25 124* 43*  ALT 20 98* 78*  ALKPHOS 46 48 36*   BILITOT 0.9 1.2 0.7  PROT 6.9 6.4* 5.0*  ALBUMIN 4.1 3.5 2.8*     CBG: No results for input(s): "GLUCAP" in the last 168 hours.   Recent Results (from the past 240 hour(s))  Urine Culture     Status: None   Collection Time: 06/16/22  9:19 PM   Specimen: Urine, Random  Result Value Ref Range Status   Specimen Description   Final    URINE, RANDOM Performed at [facility], [address] [place], [place] [address]    Special Requests   Final    NONE Performed at [facility], [address] [place], [place] [address]    Culture   Final    NO GROWTH Performed at [facility], [address], [place], [place] [address]    Report Status 06/18/2022 FINAL  Final  SARS Coronavirus 2 by RT PCR (hospital order, performed in [facility] hospital lab) *cepheid single result test* Anterior Nasal Swab  Status: None   Collection Time: 06/16/22 10:11 PM   Specimen: Anterior Nasal Swab  Result Value Ref Range Status   SARS Coronavirus 2 by RT PCR NEGATIVE NEGATIVE Final    Comment: (NOTE) SARS-CoV-2 target nucleic acids are NOT DETECTED.  The SARS-CoV-2 RNA is generally detectable in upper and lower respiratory specimens during the acute phase of infection. The lowest concentration of SARS-CoV-2 viral copies this assay can detect is 250 copies / mL. A negative result does not preclude SARS-CoV-2 infection and should not be used as the sole basis for treatment or other patient management decisions.  A negative result may occur with improper specimen collection / handling, submission of specimen other than nasopharyngeal swab, presence of viral mutation(s) within the areas targeted by this assay, and inadequate number of viral copies (<250 copies / mL). A negative result must be combined with clinical observations, patient history, and epidemiological information.  Fact Sheet for Patients:   RoadLapTop.co.za  Fact Sheet for  Healthcare Providers: http://kim-miller.com/  This test is not yet approved or  cleared by the [place] FDA and has been authorized for detection and/or diagnosis of SARS-CoV-2 by FDA under an Emergency Use Authorization (EUA).  This EUA will remain in effect (meaning this test can be used) for the duration of the COVID-19 declaration under Section 564(b)(1) of the Act, 21 U.S.C. section 360bbb-3(b)(1), unless the authorization is terminated or revoked sooner.  Performed at [facility], [address] [place], [place] [address]   Culture, blood (Routine X 2) w Reflex to ID Panel     Status: None (Preliminary result)   Collection Time: 06/17/22  1:37 PM   Specimen: BLOOD  Result Value Ref Range Status   Specimen Description BLOOD LEFT AC  Final   Special Requests   Final    BOTTLES DRAWN AEROBIC AND ANAEROBIC Blood Culture adequate volume   Culture   Final    NO GROWTH < 24 HOURS Performed at [facility], [address], [place], [place] [address]    Report Status PENDING  Incomplete  Culture, blood (Routine X 2) w Reflex to ID Panel     Status: None (Preliminary result)   Collection Time: 06/17/22  1:42 PM   Specimen: BLOOD  Result Value Ref Range Status   Specimen Description BLOOD LEFT HAND  Final   Special Requests   Final    BOTTLES DRAWN AEROBIC AND ANAEROBIC Blood Culture adequate volume   Culture   Final    NO GROWTH < 24 HOURS Performed at [facility], [address], [place], [place] [address]    Report Status PENDING  Incomplete    Radiology Studies: DG C-Arm 1-60 Min-No Report  Result Date: 06/17/2022 Fluoroscopy was utilized by the requesting physician.  No radiographic interpretation.   US Abdomen Limited RUQ (LIVER/GB)  Result Date: 06/16/2022 CLINICAL DATA:  [id number] EXAM: ULTRASOUND ABDOMEN LIMITED RIGHT UPPER QUADRANT COMPARISON:  CT renal 06/16/2022 FINDINGS: Gallbladder: Hydropic gallbladder with  gallstones noted within the lumen. Gallbladder wall thickening and pericholecystic fluid noted. Positive sonographic Eulah Pont sign was reported by the ultrasound technician. Common bile duct: Diameter: 6 mm. Liver: There is a 2.8 x 2.2 x 3 cm simple appearing right hepatic cystic lesion. Otherwise no focal lesion identified. Within normal limits in parenchymal echogenicity. Portal vein is patent on color Doppler imaging with normal direction of blood flow towards the liver. Other: None. IMPRESSION: Cholelithiasis with acute cholecystitis. Electronically Signed   By: Blanchie Serve  Tessie Fass M.D.   On: 06/16/2022 23:14   CT Renal Stone Study  Result Date: 06/16/2022 CLINICAL DATA:  Right flank pain. EXAM: CT ABDOMEN AND PELVIS WITHOUT CONTRAST TECHNIQUE: Multidetector CT imaging of the abdomen and pelvis was performed following the standard protocol without IV contrast. RADIATION DOSE REDUCTION: This exam was performed according to the departmental dose-optimization program which includes automated exposure control, adjustment of the mA and/or kV according to patient size and/or use of iterative reconstruction technique. COMPARISON:  None Available. FINDINGS: Lower chest: No acute abnormality. Hepatobiliary: No focal liver abnormality is seen. The gallbladder is moderately distended with a mild amount of pericholecystic inflammatory fat stranding. No gallstones are identified. There is no evidence of biliary dilatation. Pancreas: Unremarkable. No pancreatic ductal dilatation or surrounding inflammatory changes. Spleen: Normal in size without focal abnormality. Adrenals/Urinary Tract: Adrenal glands are unremarkable. Kidneys are normal in size without focal lesions. A 6 mm obstructing renal calculus is seen within the mid left ureter with mild to moderate severity left-sided hydronephrosis and hydroureter. A 4 mm nonobstructing renal calculus is seen within the lower pole of the left kidney. Bladder is unremarkable.  Stomach/Bowel: Stomach is within normal limits. The appendix is surgically absent. No evidence of bowel wall thickening, distention, or inflammatory changes. Noninflamed diverticula are seen throughout the mid to distal sigmoid colon. Vascular/Lymphatic: Aortic atherosclerosis. No enlarged abdominal or pelvic lymph nodes. Reproductive: Uterus and bilateral adnexa are unremarkable. Other: No abdominal wall hernia or abnormality. No abdominopelvic ascites. Musculoskeletal: No acute or significant osseous findings. IMPRESSION: 1. Findings consistent with acute cholecystitis. Correlation with right upper quadrant ultrasound is recommended. 2. 6 mm obstructing left ureteral calculus with subsequent hydronephrosis and hydroureter. 3. 4 mm nonobstructing left renal calculus. 4. Sigmoid diverticulosis. 5. Aortic atherosclerosis. Aortic Atherosclerosis (ICD10-I70.0). Electronically Signed   By: Aram Candela M.D.   On: 06/16/2022 22:05     Scheduled Meds:  atorvastatin  10 mg Oral Daily   folic acid  1 mg Oral Daily   multivitamin with minerals  1 tablet Oral Daily   polyethylene glycol  17 g Oral Daily   predniSONE  10 mg Oral Daily   [START ON 06/21/2022] Vitamin D (Ergocalciferol)  50,000 Units Oral Q Sun   Continuous Infusions:  sodium chloride 10 mL/hr at 06/18/22 0052   0.9 % NaCl with KCl 20 mEq / L 100 mL/hr at 06/18/22 1146   piperacillin-tazobactam 3.375 g (06/18/22 1029)     LOS: 1 day    Joycelyn Das, MD [facility] 06/18/2022, 12:53 PM

## 2022-06-18 NOTE — Progress Notes (Signed)
[facility] SURGICAL PROGRESS NOTE  Hospital Day(s): 1.   Post op day(s): 1 Day Post-Op.   Interval History:  Patient seen and examined No acute events or new complaints overnight.  Patient reports she is sore but otherwise doing well No fever, chills, nausea, emesis Leukocytosis is improving; 10.8K Hgb to 9.9; suspect this is dilutional Renal function normal; sCr - 0.95; UO -1720 ccs No significant electrolyte derangements Surgical drain with 105 ccs out; serous  She is on regular diet She continues on Zosyn  Vital signs in last 24 hours: [min-max] current  Temp:  [97.6 F (36.4 C)-99.3 F (37.4 C)] 97.7 F (36.5 C) (07/06 0358) Pulse Rate:  [55-93] 55 (07/06 0358) Resp:  [10-21] 18 (07/06 0358) BP: (97-118)/(48-68) 102/56 (07/06 0358) SpO2:  [90 %-98 %] 97 % (07/06 0358) Weight:  [78.5 kg] 78.5 kg (07/05 0842)     Height: 5\' 6"  (167.6 cm) Weight: 78.5 kg BMI (Calculated): 27.94   Intake/Output last 2 shifts:  07/05 0701 - 07/06 0700 In: 1050 [I.V.:1000; IV Piggyback:50] Out: 1850 [Urine:1720; Drains:105; Blood:25]   Physical Exam:  Constitutional: alert, cooperative and no distress  Respiratory: breathing non-labored at rest  Cardiovascular: regular rate and sinus rhythm  Gastrointestinal: Soft, incisional soreness, non-distended, no rebound/guarding. Surgical drain in right lateral incision; output serous  Integumentary: Laparoscopic incisions are CDI with dermabond; no erythema or drainage   Labs:     Latest Ref Rng & Units 06/18/2022    4:37 AM 06/17/2022    4:51 AM 06/16/2022    9:19 PM  CBC  WBC 4.0 - 10.5 K/uL 10.8  12.4  14.3   Hemoglobin 12.0 - 15.0 g/dL 9.9  08/17/2022  10.2   Hematocrit 36.0 - 46.0 % 30.5  36.6  35.9   Platelets 150 - 400 K/uL 172  232  247       Latest Ref Rng & Units 06/18/2022    4:37 AM 06/17/2022    4:51 AM 06/16/2022    9:19 PM  CMP  Glucose 70 - 99 mg/dL 08/17/2022  277  824   BUN 8 - 23 mg/dL 17  16  19    Creatinine 0.44 -  1.00 mg/dL 235   3.61   Sodium 135 - 145 mmol/L 140  140  138   Potassium 3.5 - 5.1 mmol/L 4.4  4.0  3.6   Chloride 98 - 111 mmol/L 111  108  105   CO2 22 - 32 mmol/L 26  26  24    Calcium 8.9 - 10.3 mg/dL 9.0  9.4  9.7   Total Protein 6.5 - 8.1 g/dL 5.0  6.4  6.9   Total Bilirubin 0.3 - 1.2 mg/dL 0.7  1.2  0.9   Alkaline Phos 38 - 126 U/L 36  48  46   AST 15 - 41 U/L 43  124  25   ALT 0 - 44 U/L 78  98  20     Imaging studies: No new pertinent imaging studies   Assessment/Plan: 61 y.o. female 1 Day Post-Op s/p robotic assisted laparoscopic cholecystectomy for acute gangrenous cholecystitis as well as concomitant 7 mm left proximal ureteral stone with hydronephrosis s/p stent placement with urology, complicated by pertinent comorbidities including RA on Xeljanz and Methotrexate.   - Okay to continue regular diet   - Continue IV Abs (Zosyn); will benefit from Augmentin for home. Recommend 14 days total (IV + PO) from general surgery perspective given gangrenous nature of  gallbladder intra-operatively - Continue surgical drain; monitor and record output. She will do home with this. Will need drain teaching   - Monitor abdominal examination - Pain control prn; antiemetics prn   - Monitor leukocytosis; improved - Hold Xeljanz and Methotrexate for 7 days post-operatively; restart 07/12  - Mobilization as tolerated  - Further management per consulting and primary service; we will follow    - Discharge Planning: She will benefit from an additional 24 hours of IV Abx given gangrenous cholecystitis, otherwise doing well. Anticipate DC home tomorrow (07/07) with above recommendations.   All of the above findings and recommendations were discussed with the patient, and the medical team, and all of patient's questions were answered to her expressed satisfaction.  -- Lynden Oxford, PA-C [facility] 06/18/2022, 7:30 AM M-F: 7am - 4pm

## 2022-06-18 NOTE — Progress Notes (Signed)
Urology Inpatient Progress Note  Subjective: No acute events overnight.  She is afebrile, VSS. Creatinine stable today, 0.95.  WBC count down today, 10.8.  Blood cultures pending with no growth at <24 hours.  Urine culture pending, on antibiotics as below. She reports feeling much better today.  She is having some lower abdominal discomfort.  She has some urge incontinence at baseline so has not noticed any acute worsening in this.  She may have noticed some gross hematuria.  Anti-infectives: Anti-infectives (From admission, onward)    Start     Dose/Rate Route Frequency Ordered Stop   06/18/22 0600  piperacillin-tazobactam (ZOSYN) IVPB 3.375 g  Status:  Discontinued        3.375 g 12.5 mL/hr over 240 Minutes Intravenous Every 8 hours 06/17/22 0019 06/17/22 1437   06/17/22 1700  piperacillin-tazobactam (ZOSYN) IVPB 3.375 g        3.375 g 12.5 mL/hr over 240 Minutes Intravenous Every 8 hours 06/17/22 1437     06/17/22 0841  piperacillin-tazobactam (ZOSYN) 3.375 GM/50ML IVPB       Note to Pharmacy: Desma Paganini S: cabinet override      06/17/22 0841 06/17/22 0924   06/16/22 2215  piperacillin-tazobactam (ZOSYN) IVPB 3.375 g        3.375 g 100 mL/hr over 30 Minutes Intravenous  Once 06/16/22 2211 06/17/22 0112       Current Facility-Administered Medications  Medication Dose Route Frequency Provider Last Rate Last Admin   0.9 %  sodium chloride infusion   Intravenous PRN Mansy, Jan A, MD 10 mL/hr at 06/18/22 0052 New Bag at 06/18/22 0052   0.9 % NaCl with KCl 20 mEq/ L  infusion   Intravenous Continuous Piscoya, Jose, MD 100 mL/hr at 06/18/22 0205 New Bag at 06/18/22 0205   acetaminophen (TYLENOL) tablet 1,000 mg  1,000 mg Oral Q6H PRN Henrene Dodge, MD   1,000 mg at 06/17/22 2034   atorvastatin (LIPITOR) tablet 10 mg  10 mg Oral Daily Piscoya, Elita Quick, MD   10 mg at 06/17/22 2009   diclofenac Sodium (VOLTAREN) 1 % topical gel 2 g  2 g Topical BID PRN Piscoya, Elita Quick, MD       docusate sodium  (COLACE) capsule 100 mg  100 mg Oral Daily PRN Piscoya, Jose, MD       folic acid (FOLVITE) tablet 1 mg  1 mg Oral Daily Piscoya, Jose, MD   1 mg at 06/17/22 2011   HYDROmorphone (DILAUDID) injection 0.5 mg  0.5 mg Intravenous Q4H PRN Piscoya, Jose, MD       magnesium hydroxide (MILK OF MAGNESIA) suspension 30 mL  30 mL Oral Daily PRN Piscoya, Jose, MD       multivitamin with minerals tablet 1 tablet  1 tablet Oral Daily Piscoya, Jose, MD   1 tablet at 06/17/22 2010   ondansetron (ZOFRAN) tablet 4 mg  4 mg Oral Q6H PRN Piscoya, Jose, MD       Or   ondansetron (ZOFRAN) injection 4 mg  4 mg Intravenous Q6H PRN Piscoya, Jose, MD       oxyCODONE (Oxy IR/ROXICODONE) immediate release tablet 5-10 mg  5-10 mg Oral Q4H PRN Piscoya, Jose, MD   5 mg at 06/18/22 0808   piperacillin-tazobactam (ZOSYN) IVPB 3.375 g  3.375 g Intravenous Q8H Piscoya, Jose, MD 12.5 mL/hr at 06/18/22 0054 3.375 g at 06/18/22 0054   polyethylene glycol (MIRALAX / GLYCOLAX) packet 17 g  17 g Oral Daily Zigmund Daniel.,  MD       traZODone (DESYREL) tablet 25 mg  25 mg Oral QHS PRN Henrene Dodge, MD       [START ON 06/21/2022] Vitamin D (Ergocalciferol) (DRISDOL) capsule 50,000 Units  50,000 Units Oral Q Quintella Reichert, Jose, MD       Objective: Vital signs in last 24 hours: Temp:  [97.6 F (36.4 C)-99.3 F (37.4 C)] 97.7 F (36.5 C) (07/06 0358) Pulse Rate:  [54-93] 54 (07/06 0853) Resp:  [10-21] 18 (07/06 0853) BP: (97-123)/(48-68) 123/63 (07/06 0853) SpO2:  [90 %-98 %] 96 % (07/06 0853)  Intake/Output from previous day: 07/05 0701 - 07/06 0700 In: 1050 [I.V.:1000; IV Piggyback:50] Out: 1850 [Urine:1720; Drains:105; Blood:25] Intake/Output this shift: No intake/output data recorded.  Physical Exam Vitals and nursing note reviewed.  Constitutional:      General: She is not in acute distress.    Appearance: She is not ill-appearing, toxic-appearing or diaphoretic.  HENT:     Head: Normocephalic and atraumatic.   Pulmonary:     Effort: Pulmonary effort is normal. No respiratory distress.  Skin:    General: Skin is warm and dry.  Neurological:     Mental Status: She is alert and oriented to person, place, and time.  Psychiatric:        Mood and Affect: Mood normal.        Behavior: Behavior normal.    Lab Results:  Recent Labs    06/17/22 0451 06/18/22 0437  WBC 12.4* 10.8*  HGB 11.8* 9.9*  HCT 36.6 30.5*  PLT 232 172   BMET Recent Labs    06/17/22 0451 06/18/22 0437  NA 140 140  K 4.0 4.4  CL 108 111  CO2 26 26  GLUCOSE 139* 168*  BUN 16 17  CREATININE 0.91 0.95  CALCIUM 9.4 9.0   Assessment & Plan: 61 year old female POD 1 from cystoscopy and left ureteral stent placement with Dr. Richardo Hanks for management of a 6 mm obstructing proximal left ureteral stone.  She also underwent robotic assisted cholecystectomy with Dr. Aleen Campi and the same procedure for management of concomitant acute cholecystitis.  She reports feeling well today and is clinically improving.  We discussed that we recommend she undergo follow-up outpatient left ureteroscopy with laser lithotripsy and stent replacement in 2 to 4 weeks with Dr. Richardo Hanks, orders have already been placed.  Common stent symptoms include flank pain, dysuria, urgency, frequency, urge incontinence, and gross hematuria.  She is rather asymptomatic at this time, so will defer pharmacotherapy but we discussed adding on medications if she becomes symptomatic of her stent.  All questions answered, patient is in agreement with this plan.  Okay for discharge from the urologic perspective.  Carman Ching, PA-C 06/18/2022

## 2022-06-19 LAB — CBC
HCT: 31.2 % — ABNORMAL LOW (ref 36.0–46.0)
Hemoglobin: 9.9 g/dL — ABNORMAL LOW (ref 12.0–15.0)
MCH: 29.6 pg (ref 26.0–34.0)
MCHC: 31.7 g/dL (ref 30.0–36.0)
MCV: 93.4 fL (ref 80.0–100.0)
Platelets: 171 10*3/uL (ref 150–400)
RBC: 3.34 MIL/uL — ABNORMAL LOW (ref 3.87–5.11)
RDW: 15.2 % (ref 11.5–15.5)
WBC: 10.4 10*3/uL (ref 4.0–10.5)
nRBC: 0 % (ref 0.0–0.2)

## 2022-06-19 LAB — BASIC METABOLIC PANEL
Anion gap: 4 — ABNORMAL LOW (ref 5–15)
BUN: 19 mg/dL (ref 8–23)
CO2: 26 mmol/L (ref 22–32)
Calcium: 9.5 mg/dL (ref 8.9–10.3)
Chloride: 109 mmol/L (ref 98–111)
Creatinine, Ser: 1.06 mg/dL — ABNORMAL HIGH (ref 0.44–1.00)
GFR, Estimated: 60 mL/min — ABNORMAL LOW (ref >60)
Glucose, Bld: 98 mg/dL (ref 70–99)
Potassium: 4.1 mmol/L (ref 3.5–5.1)
Sodium: 139 mmol/L (ref 135–145)

## 2022-06-19 LAB — URINE CULTURE: Culture: NO GROWTH

## 2022-06-19 MED ORDER — XELJANZ 10 MG PO TABS: ORAL_TABLET | ORAL | Status: DC

## 2022-06-19 MED ORDER — AMOXICILLIN-POT CLAVULANATE 875-125 MG PO TABS: 1.0000 | ORAL_TABLET | Freq: Two times a day (BID) | ORAL | 0 refills | Status: AC

## 2022-06-19 MED ORDER — PREDNISONE 5 MG PO TABS: ORAL_TABLET | ORAL | 0 refills | Status: AC

## 2022-06-19 MED ORDER — METHOTREXATE 2.5 MG PO TABS: ORAL_TABLET | ORAL | Status: DC

## 2022-06-19 NOTE — Progress Notes (Signed)
Patient educated on discharge instructions, medications, and follow up appointments. Patient verbalized understanding. Will be escorted out by auxiliary.

## 2022-06-19 NOTE — Discharge Summary (Signed)
Physician Discharge Summary  Lori Banks JHE:[id number] DOB: [birth year] DOA: 06/16/2022  PCP: Sherrie Mustache, MD  Admit date: 06/16/2022 Discharge date: 06/19/2022  Admitted From: Home  Discharge disposition: Home  Recommendations for Outpatient Follow-Up:   Follow up with your primary care provider in one week.  Check CBC, BMP, magnesium in the next visit Follow-up with general surgery on 06/26/2022 Follow-up with urology in 2 to 3 weeks.   Discharge Diagnosis:   Principal Problem:   Acute cholecystitis Active Problems:   Acute unilateral obstructive uropathy   Rheumatoid arthritis involving multiple sites [facility])   Chronic obstructive pulmonary disease (COPD) (HCC)   Dyslipidemia   Discharge Condition: Improved.  Diet recommendation:   Regular.  Wound care: None.  Code status: Full.   History of Present Illness:   Lori Banks is a 61 y.o. female with past medical history of COPD, rheumatoid arthritis on methotrexate and xeljanz, nephrolithiasis presented to hospital with acute right-sided upper abdominal pain as well as left flank pain with nausea and vomiting.  In the ED, vitals were stable.  Had significant leukocytosis at 14.3.  X-ray of the chest was unremarkable but abdominal CT scan of the abdomen showed acute cholecystitis with 6 mm obstructing left ureteral calculus with hydronephrosis and hydroureter.  Patient was then admitted hospital for further evaluation and treatment. During hospitalization, patient was seen by general surgery and underwent robotic assisted cholecystectomy on 06/17/2022 and ureteral stent placement on 06/17/2022.      Hospital Course:   Following conditions were addressed during hospitalization as listed below,  Acute gangrenous cholecystitis  Status post robotic assisted cholecystectomy on 06/17/2022.  General surgery on board.  Drain still in place.  Seen by general surgery.  Tentative plan for disposition home on 06/19/2022  with oral Augmentin-total 14-day course of antibiotic.Marland Kitchen  Recommendation is to continue IV antibiotic for next 24 hours.  Patient has been continued on regular diet.   Acute left obstructive uropathy Initial urine culture was negative.  Repeat urine culture no growth so far.  Patient i was on Zosyn during hospitalization which will be changed to Augmentin on discharge. Underwent  cystoscopy, L retrograde pyelogram with L ureteral stent placement on 06/17/22.  Urology has plans for stent change in 2 to 4 weeks as outpatient with left ureteroscopy laser lithotripsy.   Rheumatoid arthritis involving multiple sites [facility]) Hold methotrexate/xeljanz for now.  General surgery recommends holding both for 7 days and restart 06/24/2022.   Chronic obstructive pulmonary disease (COPD) (HCC) Continue DuoNebs.  Currently compensated.  Dyslipidemia Continue statins on discharge.   Disposition.  At this time, patient is stable for disposition home with outpatient PCP, general surgery, urology follow-up.  Medical Consultants:   Urology General surgery  Procedures:    Cystoscopy, left retrograde pyelogram with intraoperative interpretation, left ureteral stent placement on 06/17/2022  Robotic assisted cholecystectomy with cholangiogram on 06/17/2022 Subjective:   Today, patient was seen and examined at bedside.  Denies any nausea vomiting fever chills or rigor.  Has tolerated oral diet.  Discharge Exam:   Vitals:   06/19/22 0343 06/19/22 0752  BP:  130/70  Pulse: (!) 52 (!) 52  Resp:  18  Temp:  98 F (36.7 C)  SpO2:  97%   Vitals:   06/18/22 2353 06/19/22 0327 06/19/22 0343 06/19/22 0752  BP: (!) 107/53 128/66  130/70  Pulse: (!) 55 (!) 49 (!) 52 (!) 52  Resp: 18 18  18   Temp: 98.5 F (36.9 C)  97.9 F (36.6 C)  98 F (36.7 C)  TempSrc: Oral   Oral  SpO2: 97% 96%  97%  Weight:      Height:       General: Alert awake, not in obvious distress HENT: pupils equally reacting to light,  No  scleral pallor or icterus noted. Oral mucosa is moist.  Chest:  Clear breath sounds.  Diminished breath sounds bilaterally. No crackles or wheezes.  CVS: S1 &S2 heard. No murmur.  Regular rate and rhythm. Abdomen: Soft, nontender, nondistended.  Bowel sounds are heard.  Laparoscopic site healthy. Extremities: No cyanosis, clubbing or edema.  Peripheral pulses are palpable. Psych: Alert, awake and oriented, normal mood CNS:  No cranial nerve deficits.  Power equal in all extremities.   Skin: Warm and dry.  No rashes noted.  The results of significant diagnostics from this hospitalization (including imaging, microbiology, ancillary and laboratory) are listed below for reference.     Diagnostic Studies:   DG C-Arm 1-60 Min-No Report  Result Date: 06/17/2022 Fluoroscopy was utilized by the requesting physician.  No radiographic interpretation.   US Abdomen Limited RUQ (LIVER/GB)  Result Date: 06/16/2022 CLINICAL DATA:  [id number] EXAM: ULTRASOUND ABDOMEN LIMITED RIGHT UPPER QUADRANT COMPARISON:  CT renal 06/16/2022 FINDINGS: Gallbladder: Hydropic gallbladder with gallstones noted within the lumen. Gallbladder wall thickening and pericholecystic fluid noted. Positive sonographic Eulah Pont sign was reported by the ultrasound technician. Common bile duct: Diameter: 6 mm. Liver: There is a 2.8 x 2.2 x 3 cm simple appearing right hepatic cystic lesion. Otherwise no focal lesion identified. Within normal limits in parenchymal echogenicity. Portal vein is patent on color Doppler imaging with normal direction of blood flow towards the liver. Other: None. IMPRESSION: Cholelithiasis with acute cholecystitis. Electronically Signed   By: Tish Frederickson M.D.   On: 06/16/2022 23:14   CT Renal Stone Study  Result Date: 06/16/2022 CLINICAL DATA:  Right flank pain. EXAM: CT ABDOMEN AND PELVIS WITHOUT CONTRAST TECHNIQUE: Multidetector CT imaging of the abdomen and pelvis was performed following the standard protocol  without IV contrast. RADIATION DOSE REDUCTION: This exam was performed according to the departmental dose-optimization program which includes automated exposure control, adjustment of the mA and/or kV according to patient size and/or use of iterative reconstruction technique. COMPARISON:  None Available. FINDINGS: Lower chest: No acute abnormality. Hepatobiliary: No focal liver abnormality is seen. The gallbladder is moderately distended with a mild amount of pericholecystic inflammatory fat stranding. No gallstones are identified. There is no evidence of biliary dilatation. Pancreas: Unremarkable. No pancreatic ductal dilatation or surrounding inflammatory changes. Spleen: Normal in size without focal abnormality. Adrenals/Urinary Tract: Adrenal glands are unremarkable. Kidneys are normal in size without focal lesions. A 6 mm obstructing renal calculus is seen within the mid left ureter with mild to moderate severity left-sided hydronephrosis and hydroureter. A 4 mm nonobstructing renal calculus is seen within the lower pole of the left kidney. Bladder is unremarkable. Stomach/Bowel: Stomach is within normal limits. The appendix is surgically absent. No evidence of bowel wall thickening, distention, or inflammatory changes. Noninflamed diverticula are seen throughout the mid to distal sigmoid colon. Vascular/Lymphatic: Aortic atherosclerosis. No enlarged abdominal or pelvic lymph nodes. Reproductive: Uterus and bilateral adnexa are unremarkable. Other: No abdominal wall hernia or abnormality. No abdominopelvic ascites. Musculoskeletal: No acute or significant osseous findings. IMPRESSION: 1. Findings consistent with acute cholecystitis. Correlation with right upper quadrant ultrasound is recommended. 2. 6 mm obstructing left ureteral calculus with subsequent hydronephrosis and hydroureter. 3. 4 mm nonobstructing  left renal calculus. 4. Sigmoid diverticulosis. 5. Aortic atherosclerosis. Aortic Atherosclerosis  (ICD10-I70.0). Electronically Signed   By: Aram Candela M.D.   On: 06/16/2022 22:05     Labs:   Basic Metabolic Panel: Recent Labs  Lab 06/16/22 2119 06/17/22 0451 06/18/22 0437 06/19/22 0730  NA 138 140 140 139  K 3.6 4.0 4.4 4.1  CL 105 108 111 109  CO2 GLUCOSE 168* 139* 168* 98  BUN CREATININE 0.74 0.91 0.95 1.06*  CALCIUM 9.7 9.4 9.0 9.5   GFR Estimated Creatinine Clearance: 58.9 mL/min (A) (by C-G formula based on SCr of 1.06 mg/dL (H)). Liver Function Tests: Recent Labs  Lab 06/16/22 2119 06/17/22 0451 06/18/22 0437  AST 25 124* 43*  ALT 20 98* 78*  ALKPHOS 46 48 36*  BILITOT 0.9 1.2 0.7  PROT 6.9 6.4* 5.0*  ALBUMIN 4.1 3.5 2.8*   Recent Labs  Lab 06/16/22 2119  LIPASE 35   No results for input(s): "AMMONIA" in the last 168 hours. Coagulation profile No results for input(s): "INR", "PROTIME" in the last 168 hours.  CBC: Recent Labs  Lab 06/16/22 2119 06/17/22 0451 06/18/22 0437 06/19/22 0730  WBC 14.3* 12.4* 10.8* 10.4  NEUTROABS 13.0*  --   --   --   HGB 11.7* 11.8* 9.9* 9.9*  HCT 35.9* 36.6 30.5* 31.2*  MCV 92.3 93.4 93.3 93.4  PLT 247 232 172 171   Cardiac Enzymes: No results for input(s): "CKTOTAL", "CKMB", "CKMBINDEX", "TROPONINI" in the last 168 hours. BNP: Invalid input(s): "POCBNP" CBG: No results for input(s): "GLUCAP" in the last 168 hours. D-Dimer No results for input(s): "DDIMER" in the last 72 hours. Hgb A1c No results for input(s): "HGBA1C" in the last 72 hours. Lipid Profile No results for input(s): "CHOL", "HDL", "LDLCALC", "TRIG", "CHOLHDL", "LDLDIRECT" in the last 72 hours. Thyroid function studies No results for input(s): "TSH", "T4TOTAL", "T3FREE", "THYROIDAB" in the last 72 hours.  Invalid input(s): "FREET3" Anemia work up No results for input(s): "VITAMINB12", "FOLATE", "FERRITIN", "TIBC", "IRON", "RETICCTPCT" in the last 72 hours. Microbiology Recent Results (from the past 240  hour(s))  Urine Culture     Status: None   Collection Time: 06/16/22  9:19 PM   Specimen: Urine, Random  Result Value Ref Range Status   Specimen Description   Final    URINE, RANDOM Performed at [facility], [address] [place], [place] [address]    Special Requests   Final    NONE Performed at [facility], [address] [place], [place] [address]    Culture   Final    NO GROWTH Performed at [facility], [address], [place], [place] [address]    Report Status 06/18/2022 FINAL  Final  SARS Coronavirus 2 by RT PCR (hospital order, performed in [facility] hospital lab) *cepheid single result test* Anterior Nasal Swab     Status: None   Collection Time: 06/16/22 10:11 PM   Specimen: Anterior Nasal Swab  Result Value Ref Range Status   SARS Coronavirus 2 by RT PCR NEGATIVE NEGATIVE Final    Comment: (NOTE) SARS-CoV-2 target nucleic acids are NOT DETECTED.  The SARS-CoV-2 RNA is generally detectable in upper and lower respiratory specimens during the acute phase of infection. The lowest concentration of SARS-CoV-2 viral copies this assay can detect is 250 copies / mL. A negative result does not preclude SARS-CoV-2 infection and should not be used as the  sole basis for treatment or other patient management decisions.  A negative result may occur with improper specimen collection / handling, submission of specimen other than nasopharyngeal swab, presence of viral mutation(s) within the areas targeted by this assay, and inadequate number of viral copies (<250 copies / mL). A negative result must be combined with clinical observations, patient history, and epidemiological information.  Fact Sheet for Patients:   RoadLapTop.co.za  Fact Sheet for Healthcare Providers: http://kim-miller.com/  This test is not yet approved or  cleared by the [place] FDA and has been authorized for detection  and/or diagnosis of SARS-CoV-2 by FDA under an Emergency Use Authorization (EUA).  This EUA will remain in effect (meaning this test can be used) for the duration of the COVID-19 declaration under Section 564(b)(1) of the Act, 21 U.S.C. section 360bbb-3(b)(1), unless the authorization is terminated or revoked sooner.  Performed at [facility], [address] [place], [place] [address]   Culture, blood (Routine X 2) w Reflex to ID Panel     Status: None (Preliminary result)   Collection Time: 06/17/22  1:37 PM   Specimen: BLOOD  Result Value Ref Range Status   Specimen Description BLOOD LEFT [facility]  Final   Special Requests   Final    BOTTLES DRAWN AEROBIC AND ANAEROBIC Blood Culture adequate volume   Culture   Final    NO GROWTH 2 DAYS Performed at [facility], [address], [place], [place] [address]    Report Status PENDING  Incomplete  Culture, blood (Routine X 2) w Reflex to ID Panel     Status: None (Preliminary result)   Collection Time: 06/17/22  1:42 PM   Specimen: BLOOD  Result Value Ref Range Status   Specimen Description BLOOD LEFT HAND  Final   Special Requests   Final    BOTTLES DRAWN AEROBIC AND ANAEROBIC Blood Culture adequate volume   Culture   Final    NO GROWTH 2 DAYS Performed at [facility], [address] [place], [place] [address]    Report Status PENDING  Incomplete  Urine Culture     Status: None   Collection Time: 06/17/22  8:30 PM   Specimen: Urine, Clean Catch  Result Value Ref Range Status   Specimen Description   Final    URINE, CLEAN CATCH Performed at [facility], [address], [place], [place] [address]    Special Requests   Final    NONE Performed at [facility], [address], [place], [place] [address]    Culture   Final    NO GROWTH Performed at [facility], [address], [place], [place] [address]    Report Status 06/19/2022 FINAL  Final     Discharge  Instructions:   Discharge Instructions     Call MD for:  persistant nausea and vomiting   Complete by: As directed    Call MD for:  severe uncontrolled pain   Complete by: As directed    Call MD for:  temperature >100.4   Complete by: As directed    Diet general   Complete by: As directed    Discharge instructions   Complete by: As directed    Follow up with your primary care provider in one week. Complete the course of antibiotics. Follow up with General surgery on 06/26/22 for drain management and wound check up . Follow up with Dr Richardo Hanks urology for definite stone management in 2-4 weeks.Marland Kitchen  Start taking methotrexate and Xeljanz from 06/24/22 and do not take until then.  Seek medical attention for worsening symptoms.   Increase activity slowly   Complete by: As directed    No wound care   Complete by: As directed       Allergies as of 06/19/2022   No Known Allergies      Medication List     TAKE these medications    amoxicillin-clavulanate 875-125 MG tablet Commonly known as: AUGMENTIN Take 1 tablet by mouth 2 (two) times daily for 12 days.   atorvastatin 10 MG tablet Commonly known as: LIPITOR Take 10 mg by mouth daily.   diclofenac Sodium 1 % Gel Commonly known as: VOLTAREN Apply topically 2 (two) times daily as needed (pain).   docusate sodium 100 MG capsule Commonly known as: Colace Take 1 capsule (100 mg total) by mouth daily as needed.   ergocalciferol 1.25 MG (50000 UT) capsule Commonly known as: VITAMIN D2 Take 50,000 Units by mouth once a week.   folic acid 1 MG tablet Commonly known as: FOLVITE Take 1 tablet (1 mg total) by mouth daily.   methotrexate 2.5 MG tablet Commonly known as: RHEUMATREX Take 7.5mg  in the morning and with dinner one day per week. Start taking on: June 24, 2022 What changed: These instructions start on June 24, 2022. If you are unsure what to do until then, ask your doctor or other care provider.   multivitamin with minerals  Tabs tablet Take 1 tablet by mouth daily.   oxyCODONE-acetaminophen 5-325 MG tablet Commonly known as: Percocet Take 1 tablet by mouth every 4 (four) hours as needed for severe pain.   predniSONE 5 MG tablet Commonly known as: DELTASONE Take 2 tablets (10 mg total) by mouth daily with breakfast for 2 days, THEN 1 tablet (5 mg total) daily with breakfast for 3 days. Start taking on: June 19, 2022 What changed: See the new instructions.   Xeljanz 10 MG Tabs Generic drug: Tofacitinib Citrate TAKE 1/2 TABLET BY MOUTH IN THE MORNING AND AT BEDTIME Start taking on: June 24, 2022 What changed: These instructions start on June 24, 2022. If you are unsure what to do until then, ask your doctor or other care provider.        Follow-up Information     Henrene Dodge, MD. Go on 06/26/2022.   Specialty: General Surgery Why: s/p cholecystectomy, has drain...go to appointment on 07/14 at 11:15 AM Contact information: [address] [place] [address] [phone]         Sondra Come, MD. Schedule an appointment as soon as possible for a visit in 2 week(s).   Specialty: Urology Why: stone and stent managment Contact information: [address] [place] [address] [phone]         Sherrie Mustache, MD Follow up in 1 week(s).   Specialty: Internal Medicine Contact information: [address] [place] [address] [phone]                  Time coordinating discharge: 39 minutes  Signed:  Barak Bialecki  [facility] 06/19/2022, 8:51 AM

## 2022-06-19 NOTE — TOC Initial Note (Signed)
Transition of Care [facility]) - Initial/Assessment Note    Patient Details  Name: Lori Banks MRN: [id number] Date of Birth: Apr 05, 1961  Transition of Care [facility]) CM/SW Contact:    Marlowe Sax, RN Phone Number: 06/19/2022, 9:31 AM  Clinical Narrative:                   Transition of Care [facility]) Screening Note   Patient Details  Name: Lori Banks Date of Birth: [birth year]   Transition of Care [facility]) CM/SW Contact:    Marlowe Sax, RN Phone Number: 06/19/2022, 9:31 AM    Transition of Care Department [facility]) has reviewed patient and no TOC needs have been identified at this time. We will continue to monitor patient advancement through interdisciplinary progression rounds. If new patient transition needs arise, please place a TOC consult.         Patient Goals and CMS Choice        Expected Discharge Plan and Services           Expected Discharge Date: 06/19/22                                    Prior Living Arrangements/Services                       Activities of Daily Living Home Assistive Devices/Equipment: Gilmer Mor (specify quad or straight) ADL Screening (condition at time of admission) Patient's cognitive ability adequate to safely complete daily activities?: Yes Is the patient deaf or have difficulty hearing?: No Does the patient have difficulty seeing, even when wearing glasses/contacts?: No Does the patient have difficulty concentrating, remembering, or making decisions?: No Patient able to express need for assistance with ADLs?: Yes Does the patient have difficulty dressing or bathing?: No Independently performs ADLs?: Yes (appropriate for developmental age) Does the patient have difficulty walking or climbing stairs?: No Weakness of Legs: None Weakness of Arms/Hands: None  Permission Sought/Granted                  Emotional Assessment              Admission diagnosis:  Acute cholecystitis  [K81.0] Immunosuppressed status (HCC) [D84.9] Left nephrolithiasis [N20.0] Other elevated white blood cell (WBC) count [D72.828] Patient Active Problem List   Diagnosis Date Noted   Acute cholecystitis 06/17/2022   Acute unilateral obstructive uropathy 06/17/2022   Chronic obstructive pulmonary disease (COPD) (HCC) 06/17/2022   Dyslipidemia 06/17/2022   Rheumatoid arthritis involving multiple sites (HCC) 12/24/2020   Prediabetes 12/24/2020   PCP:  Sherrie Mustache, MD Pharmacy:   [facility] [phone] - [place], [place] - [address] [facility] [address] [place] [address] Phone: [phone] Fax: [phone]     Social Determinants of Health (SDOH) Interventions    Readmission Risk Interventions     No data to display

## 2022-06-19 NOTE — Progress Notes (Signed)
[facility] SURGICAL PROGRESS NOTE  Hospital Day(s): 2.   Post op day(s): 2 Days Post-Op.   Interval History:  Patient seen and examined No acute events or new complaints overnight.  Patient reports she is still sore in right abdomen and at drain site No fever, chills, nausea, or emesis No new labs this morning; pending Surgical drain with 55 ccs out; serous  She is on regular diet; tolerating  She continues on Zosyn  Vital signs in last 24 hours: [min-max] current  Temp:  [97.6 F (36.4 C)-98.7 F (37.1 C)] 97.9 F (36.6 C) (07/07 0327) Pulse Rate:  [49-66] 52 (07/07 0343) Resp:  [18] 18 (07/07 0327) BP: (100-128)/(50-66) 128/66 (07/07 0327) SpO2:  [95 %-97 %] 96 % (07/07 0327)     Height: 5\' 6"  (167.6 cm) Weight: 78.5 kg BMI (Calculated): 27.94   Intake/Output last 2 shifts:  07/06 0701 - 07/07 0700 In: 2622.7 [I.V.:2472.7; IV Piggyback:150.1] Out: 2459 [Urine:2400; Drains:59]   Physical Exam:  Constitutional: alert, cooperative and no distress  Respiratory: breathing non-labored at rest  Cardiovascular: regular rate and sinus rhythm  Gastrointestinal: Soft, incisional soreness, non-distended, no rebound/guarding. Surgical drain in right lateral incision; output serous  Integumentary: Laparoscopic incisions are CDI with dermabond; no erythema or drainage   Labs:     Latest Ref Rng & Units 06/18/2022    4:37 AM 06/17/2022    4:51 AM 06/16/2022    9:19 PM  CBC  WBC 4.0 - 10.5 K/uL 10.8  12.4  14.3   Hemoglobin 12.0 - 15.0 g/dL 9.9  08/17/2022  25.8   Hematocrit 36.0 - 46.0 % 30.5  36.6  35.9   Platelets 150 - 400 K/uL 172  232  247       Latest Ref Rng & Units 06/18/2022    4:37 AM 06/17/2022    4:51 AM 06/16/2022    9:19 PM  CMP  Glucose 70 - 99 mg/dL 08/17/2022  782  423   BUN 8 - 23 mg/dL 17  16  19    Creatinine 0.44 - 1.00 mg/dL 536   1.44   Sodium 135 - 145 mmol/L 140  140  138   Potassium 3.5 - 5.1 mmol/L 4.4  4.0  3.6   Chloride 98 - 111 mmol/L 111   108  105   CO2 22 - 32 mmol/L 26  26  24    Calcium 8.9 - 10.3 mg/dL 9.0  9.4  9.7   Total Protein 6.5 - 8.1 g/dL 5.0  6.4  6.9   Total Bilirubin 0.3 - 1.2 mg/dL 0.7  1.2  0.9   Alkaline Phos 38 - 126 U/L 36  48  46   AST 15 - 41 U/L 43  124  25   ALT 0 - 44 U/L 78  98  20     Imaging studies: No new pertinent imaging studies   Assessment/Plan: 61 y.o. female 2 Days Post-Op s/p robotic assisted laparoscopic cholecystectomy for acute gangrenous cholecystitis as well as concomitant 7 mm left proximal ureteral stone with hydronephrosis s/p stent placement with urology, complicated by pertinent comorbidities including RA on Xeljanz and Methotrexate.   - Okay to continue regular diet   - Continue IV Abs (Zosyn); will benefit from Augmentin for home. Recommend 14 days total (IV + PO) from general surgery perspective given gangrenous nature of gallbladder intra-operatively - Continue surgical drain; monitor and record output. She will do home with this. Will need drain teaching   -  Monitor abdominal examination - Pain control prn; antiemetics prn   - Monitor leukocytosis; improved - Hold Xeljanz and Methotrexate for 14 days post-operatively; restart 07/19  - Mobilization as tolerated  - Further management per consulting and primary service; we will follow    - Discharge Planning: Okay for discharge home from surgical perspective; continue drain at home; Abx as above. I will arrange follow up in~1 week for drain removal.   All of the above findings and recommendations were discussed with the patient, and the medical team, and all of patient's questions were answered to her expressed satisfaction.  -- Lynden Oxford, PA-C [facility] 06/19/2022, 7:30 AM M-F: 7am - 4pm

## 2022-06-19 NOTE — Care Management Important Message (Signed)
Important Message  Patient Details  Name: Lori Banks MRN: [id number] Date of Birth: 1961/03/26   Medicare Important Message Given:  Yes     Johnell Comings 06/19/2022, 10:05 AM

## 2022-06-21 NOTE — Anesthesia Postprocedure Evaluation (Signed)
Anesthesia Post Note  Patient: Lori Banks  Procedure(s) Performed: XI ROBOTIC ASSISTED LAPAROSCOPIC CHOLECYSTECTOMY (Abdomen) INDOCYANINE GREEN FLUORESCENCE IMAGING (ICG) (Bilateral) CYSTOSCOPY WITH STENT PLACEMENT (Left: Ureter)  Patient location during evaluation: PACU Anesthesia Type: General Level of consciousness: awake and alert Pain management: pain level controlled Vital Signs Assessment: post-procedure vital signs reviewed and stable Respiratory status: spontaneous breathing, nonlabored ventilation, respiratory function stable and patient connected to nasal cannula oxygen Cardiovascular status: blood pressure returned to baseline and stable Postop Assessment: no apparent nausea or vomiting Anesthetic complications: no   No notable events documented.   Last Vitals:  Vitals:   06/19/22 0752 06/19/22 1116  BP: 130/70 110/60  Pulse: (!) 52 (!) 51  Resp: 18 18  Temp: 36.7 C 37.2 C  SpO2: 97%     Last Pain:  Vitals:   06/19/22 1116  TempSrc: Oral  PainSc:                  Lenard Simmer

## 2022-06-22 ENCOUNTER — Telehealth: Payer: Self-pay

## 2022-06-22 LAB — CULTURE, BLOOD (ROUTINE X 2)
Culture: NO GROWTH
Culture: NO GROWTH
Special Requests: ADEQUATE
Special Requests: ADEQUATE

## 2022-06-22 NOTE — Progress Notes (Signed)
[facility] Posting Form   Surgery Date/Time: Date: 07/03/2022  Surgeon: Dr. Legrand Rams, MD  Surgery Location: Day Surgery  Inpt ( No  )   Outpt (Yes)   Obs ( No  )   Diagnosis: N20.1 Left Ureteral Stone  -CPT: [phone]  Surgery: Left Ureteroscopy with laser lithotripsy and stent exchange  Stop Anticoagulations: No and may continue ASA  Cardiac/Medical/Pulmonary Clearance needed: no  *Orders entered into EPIC  Date: 06/22/22   *Case booked in EPIC  Date: 06/22/22  *Notified pt of Surgery: Date: 06/22/22  PRE-OP UA & CX: yes, will obtain in clinic on 06/23/2022  *Placed into Prior Authorization Work Angela Nevin Date: 06/22/22   Assistant/laser/rep:No

## 2022-06-22 NOTE — Telephone Encounter (Signed)
I spoke with Lori Banks. We have discussed possible surgery dates and Friday July 21st, 2023 was agreed upon by all parties. Patient given information about surgery date, what to expect pre-operatively and post operatively.  We discussed that a Pre-Admission Testing office will be calling to set up the pre-op visit that will take place prior to surgery, and that these appointments are typically done over the phone with a Pre-Admissions RN.  Informed patient that our office will communicate any additional care to be provided after surgery. Patients questions or concerns were discussed during our call. Advised to call our office should there be any additional information, questions or concerns that arise. Patient verbalized understanding.

## 2022-06-23 ENCOUNTER — Other Ambulatory Visit: Payer: Medicare (Managed Care)

## 2022-06-23 DIAGNOSIS — N201 Calculus of ureter: Secondary | ICD-10-CM

## 2022-06-23 LAB — MICROSCOPIC EXAMINATION: RBC, Urine: >30 /hpf — ABNORMAL HIGH (ref 0–2)

## 2022-06-23 LAB — URINALYSIS, COMPLETE
Bilirubin, UA: NEGATIVE
Glucose, UA: NEGATIVE
Ketones, UA: NEGATIVE
Nitrite, UA: NEGATIVE
Specific Gravity, UA: 1.015 (ref 1.005–1.030)
Urobilinogen, Ur: 0.2 mg/dL (ref 0.2–1.0)
pH, UA: 5.5 (ref 5.0–7.5)

## 2022-06-26 ENCOUNTER — Encounter: Payer: Self-pay | Admitting: Surgery

## 2022-06-26 ENCOUNTER — Ambulatory Visit (INDEPENDENT_AMBULATORY_CARE_PROVIDER_SITE_OTHER): Payer: Medicare (Managed Care) | Admitting: Surgery

## 2022-06-26 VITALS — BP 150/78 | HR 89 | Temp 99.0°F | Wt 178.8 lb

## 2022-06-26 DIAGNOSIS — K810 Acute cholecystitis: Secondary | ICD-10-CM

## 2022-06-26 DIAGNOSIS — Z09 Encounter for follow-up examination after completed treatment for conditions other than malignant neoplasm: Secondary | ICD-10-CM

## 2022-06-26 LAB — CULTURE, URINE COMPREHENSIVE

## 2022-06-26 NOTE — Patient Instructions (Signed)
You may apply Vitamin E lotion or Cocoa Butter to help with itching and irritation.    If you have any concerns or questions, please feel free to call our office. See follow up appointment below.    GENERAL POST-OPERATIVE PATIENT INSTRUCTIONS   WOUND CARE INSTRUCTIONS:  Keep a dry clean dressing on the wound if there is drainage. The initial bandage may be removed after 24 hours.  Once the wound has quit draining you may leave it open to air.  If clothing rubs against the wound or causes irritation and the wound is not draining you may cover it with a dry dressing during the daytime.  Try to keep the wound dry and avoid ointments on the wound unless directed to do so.  If the wound becomes bright red and painful or starts to drain infected material that is not clear, please contact your physician immediately.  If the wound is mildly pink and has a thick firm ridge underneath it, this is normal, and is referred to as a healing ridge.  This will resolve over the next 4-6 weeks.  BATHING: You may shower if you have been informed of this by your surgeon. However, Please do not submerge in a tub, hot tub, or pool until incisions are completely sealed or have been told by your surgeon that you may do so.  DIET:  You may eat any foods that you can tolerate.  It is a good idea to eat a high fiber diet and take in plenty of fluids to prevent constipation.  If you do become constipated you may want to take a mild laxative or take ducolax tablets on a daily basis until your bowel habits are regular.  Constipation can be very uncomfortable, along with straining, after recent surgery.  ACTIVITY:  You are encouraged to cough and deep breath or use your incentive spirometer if you were given one, every 15-30 minutes when awake.  This will help prevent respiratory complications and low grade fevers post-operatively if you had a general anesthetic.  You may want to hug a pillow when coughing and sneezing to add  additional support to the surgical area, if you had abdominal or chest surgery, which will decrease pain during these times.  You are encouraged to walk and engage in light activity for the next two weeks.  You should not lift more than 20 pounds for 6 weeks total after surgery as it could put you at increased risk for complications.  Twenty pounds is roughly equivalent to a plastic bag of groceries. At that time- Listen to your body when lifting, if you have pain when lifting, stop and then try again in a few days. Soreness after doing exercises or activities of daily living is normal as you get back in to your normal routine.  MEDICATIONS:  Try to take narcotic medications and anti-inflammatory medications, such as tylenol, ibuprofen, naprosyn, etc., with food.  This will minimize stomach upset from the medication.  Should you develop nausea and vomiting from the pain medication, or develop a rash, please discontinue the medication and contact your physician.  You should not drive, make important decisions, or operate machinery when taking narcotic pain medication.  SUNBLOCK Use sun block to incision area over the next year if this area will be exposed to sun. This helps decrease scarring and will allow you avoid a permanent darkened area over your incision.  QUESTIONS:  Please feel free to call our office if you have any questions,  and we will be glad to assist you. [phone]

## 2022-06-26 NOTE — Progress Notes (Signed)
06/26/2022  HPI: Lori Banks is a 61 y.o. female s/p robotic assisted cholecystectomy and left ureteral stent placement (Dr. Richardo Hanks) on 06/17/22.  Blake drain left in place due to gangrenous cholecystitis.  She reports she's recovering well, with still some discomfort issues but improving.  She's eating well and tolerating regular diet.  Has been emptying the drain but has not recorded the output amount.  Sounds like low volume per day.  Vital signs: BP (!) 150/78   Pulse 89   Temp 99 F (37.2 C) (Oral)   Wt 178 lb 12.8 oz (81.1 kg)   SpO2 96%   BMI 28.86 kg/m    Physical Exam: Constitutional: No acute distress Abdomen:  soft, non-distended, appropriatley sore to palpation.  Drain with serous fluid, removed and dry dressing applied.  Incisions clean, dry, intact, some ecchymosis.  Assessment/Plan: This is a 61 y.o. female s/p robotic assisted cholecystectomy  --Drain removed today, no complications.  Dry gauze dressing daily until wound fully healed. --OK to shower. --Reminded of activity restriction for 4-6 weeks. --Follow up in 2 weeks to assess progress.   Howie Ill, MD [facility]

## 2022-06-29 ENCOUNTER — Encounter
Admission: RE | Admit: 2022-06-29 | Discharge: 2022-06-29 | Disposition: A | Discharge status: Home / Self Care | Payer: Medicare (Managed Care) | Source: Ambulatory Visit | Attending: Urology | Admitting: Urology

## 2022-06-29 ENCOUNTER — Other Ambulatory Visit: Payer: Self-pay

## 2022-06-29 VITALS — Ht 66.0 in | Wt 180.0 lb

## 2022-06-29 DIAGNOSIS — E785 Hyperlipidemia, unspecified: Secondary | ICD-10-CM

## 2022-06-29 HISTORY — DX: Personal history of urinary calculi: Z87.442

## 2022-06-29 HISTORY — DX: Prediabetes: R73.03

## 2022-06-29 NOTE — Patient Instructions (Signed)
Your procedure is scheduled on: 07/03/22 Report to DAY SURGERY DEPARTMENT LOCATED ON 2ND FLOOR MEDICAL MALL ENTRANCE. To find out your arrival time please call [phone] between 1PM - 3PM on 07/02/22.  Remember: Instructions that are not followed completely may result in serious medical risk, up to and including death, or upon the discretion of your surgeon and anesthesiologist your surgery may need to be rescheduled.     _X__ 1. Do not eat any food or drink any liquids after midnight the night before your procedure.                 No gum chewing or hard candies.   __X__2.  On the morning of surgery brush your teeth with toothpaste and water, you                 may rinse your mouth with mouthwash if you wish.  Do not swallow any              toothpaste of mouthwash.     _X__ 3.  No Alcohol for 24 hours before or after surgery.   _X__ 4.  Do Not Smoke or use e-cigarettes For 24 Hours Prior to Your Surgery.                 Do not use any chewable tobacco products for at least 6 hours prior to                 surgery.  ____  5.  Bring all medications with you on the day of surgery if instructed.   __X__  6.  Notify your doctor if there is any change in your medical condition      (cold, fever, infections).     Do not wear jewelry, make-up, hairpins, clips or nail polish. Do not wear lotions, powders, or perfumes.  Do not shave body hair 48 hours prior to surgery. Men may shave face and neck. Do not bring valuables to the hospital.    [facility] is not responsible for any belongings or valuables.  Contacts, dentures/partials or body piercings may not be worn into surgery. Bring a case for your contacts, glasses or hearing aids, a denture cup will be supplied. Leave your suitcase in the car. After surgery it may be brought to your room. For patients admitted to the hospital, discharge time is determined by your treatment team.   Patients discharged the day of surgery will not be  allowed to drive home.    __X__ Take these medicines the morning of surgery with A SIP OF WATER:    1. none  2.   3.   4.  5.  6.  ____ Fleet Enema (as directed)   ____ Use CHG Soap/SAGE wipes as directed  ____ Use inhalers on the day of surgery  ____ Stop metformin/Janumet/Farxiga 2 days prior to surgery    ____ Take 1/2 of usual insulin dose the night before surgery. No insulin the morning          of surgery.   ____ Stop Blood Thinners Coumadin/Plavix/Xarelto/Pleta/Pradaxa/Eliquis/Effient/Aspirin  on   Or contact your Surgeon, Cardiologist or Medical Doctor regarding  ability to stop your blood thinners  __X__ Stop Anti-inflammatories 7 days before surgery such as Advil, Ibuprofen, Motrin,  BC or Goodies Powder, Naprosyn, Naproxen, Aleve, Aspirin    __X__ Stop all herbals and supplements, fish oil or vitamins TODAY until after surgery.    ____ Bring C-Pap to the  hospital.

## 2022-07-01 ENCOUNTER — Encounter
Admission: RE | Admit: 2022-07-01 | Discharge: 2022-07-01 | Disposition: A | Discharge status: Home / Self Care | Payer: Medicare (Managed Care) | Source: Ambulatory Visit | Attending: Urology | Admitting: Urology

## 2022-07-01 DIAGNOSIS — Z01810 Encounter for preprocedural cardiovascular examination: Secondary | ICD-10-CM | POA: Insufficient documentation

## 2022-07-01 DIAGNOSIS — E785 Hyperlipidemia, unspecified: Secondary | ICD-10-CM | POA: Insufficient documentation

## 2022-07-03 ENCOUNTER — Ambulatory Visit: Payer: Medicare (Managed Care) | Admitting: Certified Registered"

## 2022-07-03 ENCOUNTER — Other Ambulatory Visit: Payer: Self-pay

## 2022-07-03 ENCOUNTER — Encounter
Admission: RE | Disposition: A | Discharge status: Home / Self Care | Payer: Self-pay | Source: Ambulatory Visit | Attending: Urology

## 2022-07-03 ENCOUNTER — Ambulatory Visit: Payer: Medicare (Managed Care)

## 2022-07-03 ENCOUNTER — Ambulatory Visit
Admission: RE | Admit: 2022-07-03 | Discharge: 2022-07-03 | Disposition: A | Discharge status: Home / Self Care | Payer: Medicare (Managed Care) | Source: Ambulatory Visit | Attending: Urology | Admitting: Urology

## 2022-07-03 ENCOUNTER — Encounter: Payer: Self-pay | Admitting: Urology

## 2022-07-03 DIAGNOSIS — N201 Calculus of ureter: Secondary | ICD-10-CM

## 2022-07-03 DIAGNOSIS — F1290 Cannabis use, unspecified, uncomplicated: Secondary | ICD-10-CM | POA: Insufficient documentation

## 2022-07-03 DIAGNOSIS — Z9049 Acquired absence of other specified parts of digestive tract: Secondary | ICD-10-CM | POA: Diagnosis not present

## 2022-07-03 DIAGNOSIS — Z87891 Personal history of nicotine dependence: Secondary | ICD-10-CM | POA: Diagnosis not present

## 2022-07-03 DIAGNOSIS — N132 Hydronephrosis with renal and ureteral calculous obstruction: Secondary | ICD-10-CM | POA: Insufficient documentation

## 2022-07-03 DIAGNOSIS — N202 Calculus of kidney with calculus of ureter: Secondary | ICD-10-CM | POA: Diagnosis not present

## 2022-07-03 HISTORY — PX: CYSTOSCOPY/URETEROSCOPY/HOLMIUM LASER/STENT PLACEMENT: SHX6546

## 2022-07-03 HISTORY — DX: Age-related osteoporosis without current pathological fracture: M81.0

## 2022-07-03 SURGERY — CYSTOSCOPY/URETEROSCOPY/HOLMIUM LASER/STENT PLACEMENT
Anesthesia: General | Site: Ureter | Laterality: Left

## 2022-07-03 MED ORDER — CHLORHEXIDINE GLUCONATE 0.12 % MT SOLN
OROMUCOSAL | Status: AC
  Administered 2022-07-03: 15 mL via OROMUCOSAL
  Filled 2022-07-03: qty 15

## 2022-07-03 MED ORDER — IOHEXOL 180 MG/ML  SOLN
INTRAMUSCULAR | Status: DC | PRN
  Administered 2022-07-03: 20 mL

## 2022-07-03 MED ORDER — SUCCINYLCHOLINE CHLORIDE 200 MG/10ML IV SOSY
PREFILLED_SYRINGE | INTRAVENOUS | Status: DC | PRN
  Administered 2022-07-03: 100 mg via INTRAVENOUS

## 2022-07-03 MED ORDER — ONDANSETRON HCL 4 MG/2ML IJ SOLN
INTRAMUSCULAR | Status: DC | PRN
  Administered 2022-07-03 (×2): 4 mg via INTRAVENOUS

## 2022-07-03 MED ORDER — HYDROCODONE-ACETAMINOPHEN 5-325 MG PO TABS
1.0000 | ORAL_TABLET | Freq: Four times a day (QID) | ORAL | 0 refills | Status: AC | PRN
Start: 2022-07-03 — End: 2022-07-06

## 2022-07-03 MED ORDER — MIDAZOLAM HCL 2 MG/2ML IJ SOLN
INTRAMUSCULAR | Status: DC | PRN
  Administered 2022-07-03: 2 mg via INTRAVENOUS

## 2022-07-03 MED ORDER — FENTANYL CITRATE (PF) 100 MCG/2ML IJ SOLN
25.0000 ug | INTRAMUSCULAR | Status: DC | PRN
  Administered 2022-07-03: 50 ug via INTRAVENOUS
  Administered 2022-07-03: 25 ug via INTRAVENOUS

## 2022-07-03 MED ORDER — EPHEDRINE SULFATE (PRESSORS) 50 MG/ML IJ SOLN
INTRAMUSCULAR | Status: DC | PRN
  Administered 2022-07-03: 10 mg via INTRAVENOUS

## 2022-07-03 MED ORDER — ACETAMINOPHEN 10 MG/ML IV SOLN
INTRAVENOUS | Status: DC | PRN
  Administered 2022-07-03: 1000 mg via INTRAVENOUS

## 2022-07-03 MED ORDER — OXYCODONE HCL 5 MG PO TABS
ORAL_TABLET | ORAL | Status: AC
  Filled 2022-07-03: qty 1

## 2022-07-03 MED ORDER — CEFAZOLIN SODIUM-DEXTROSE 2-4 GM/100ML-% IV SOLN
INTRAVENOUS | Status: AC
  Filled 2022-07-03: qty 100

## 2022-07-03 MED ORDER — OXYCODONE HCL 5 MG PO TABS
5.0000 mg | ORAL_TABLET | Freq: Once | ORAL | Status: AC | PRN
  Administered 2022-07-03: 5 mg via ORAL

## 2022-07-03 MED ORDER — MIDAZOLAM HCL 2 MG/2ML IJ SOLN
INTRAMUSCULAR | Status: AC
  Filled 2022-07-03: qty 2

## 2022-07-03 MED ORDER — SODIUM CHLORIDE 0.9 % IR SOLN
Status: DC | PRN
  Administered 2022-07-03: 3000 mL via INTRAVESICAL

## 2022-07-03 MED ORDER — KETOROLAC TROMETHAMINE 15 MG/ML IJ SOLN
INTRAMUSCULAR | Status: DC | PRN
  Administered 2022-07-03: 30 mg via INTRAVENOUS

## 2022-07-03 MED ORDER — FAMOTIDINE 20 MG PO TABS
ORAL_TABLET | ORAL | Status: AC
  Administered 2022-07-03: 20 mg via ORAL
  Filled 2022-07-03: qty 1

## 2022-07-03 MED ORDER — FAMOTIDINE 20 MG PO TABS: 20.0000 mg | ORAL_TABLET | Freq: Once | ORAL | Status: AC

## 2022-07-03 MED ORDER — SUGAMMADEX SODIUM 200 MG/2ML IV SOLN
INTRAVENOUS | Status: DC | PRN
  Administered 2022-07-03: 300 mg via INTRAVENOUS

## 2022-07-03 MED ORDER — FENTANYL CITRATE (PF) 100 MCG/2ML IJ SOLN
INTRAMUSCULAR | Status: AC
  Administered 2022-07-03: 25 ug via INTRAVENOUS
  Filled 2022-07-03: qty 2

## 2022-07-03 MED ORDER — LACTATED RINGERS IV SOLN: INTRAVENOUS | Status: DC

## 2022-07-03 MED ORDER — LIDOCAINE HCL (CARDIAC) PF 100 MG/5ML IV SOSY
PREFILLED_SYRINGE | INTRAVENOUS | Status: DC | PRN
  Administered 2022-07-03: 100 mg via INTRAVENOUS

## 2022-07-03 MED ORDER — GLYCOPYRROLATE 0.2 MG/ML IJ SOLN
INTRAMUSCULAR | Status: DC | PRN
  Administered 2022-07-03: .2 mg via INTRAVENOUS

## 2022-07-03 MED ORDER — ORAL CARE MOUTH RINSE: 15.0000 mL | Freq: Once | OROMUCOSAL | Status: AC

## 2022-07-03 MED ORDER — DEXAMETHASONE SODIUM PHOSPHATE 10 MG/ML IJ SOLN
INTRAMUSCULAR | Status: DC | PRN
  Administered 2022-07-03: 10 mg via INTRAVENOUS

## 2022-07-03 MED ORDER — FENTANYL CITRATE (PF) 100 MCG/2ML IJ SOLN
INTRAMUSCULAR | Status: AC
  Filled 2022-07-03: qty 2

## 2022-07-03 MED ORDER — ROCURONIUM BROMIDE 100 MG/10ML IV SOLN
INTRAVENOUS | Status: DC | PRN
  Administered 2022-07-03: 10 mg via INTRAVENOUS
  Administered 2022-07-03: 40 mg via INTRAVENOUS

## 2022-07-03 MED ORDER — NITROFURANTOIN MACROCRYSTAL 100 MG PO CAPS: 100.0000 mg | ORAL_CAPSULE | Freq: Every day | ORAL | 0 refills | Status: AC

## 2022-07-03 MED ORDER — CEFAZOLIN SODIUM-DEXTROSE 2-4 GM/100ML-% IV SOLN
2.0000 g | INTRAVENOUS | Status: AC
  Administered 2022-07-03: 2 g via INTRAVENOUS

## 2022-07-03 MED ORDER — CHLORHEXIDINE GLUCONATE 0.12 % MT SOLN: 15.0000 mL | Freq: Once | OROMUCOSAL | Status: AC

## 2022-07-03 MED ORDER — PROPOFOL 10 MG/ML IV BOLUS
INTRAVENOUS | Status: DC | PRN
  Administered 2022-07-03: 150 mg via INTRAVENOUS

## 2022-07-03 MED ORDER — OXYCODONE HCL 5 MG/5ML PO SOLN: 5.0000 mg | Freq: Once | ORAL | Status: AC | PRN

## 2022-07-03 SURGICAL SUPPLY — 32 items
ADH LQ OCL WTPRF AMP STRL LF (MISCELLANEOUS) ×1
ADHESIVE MASTISOL STRL (MISCELLANEOUS) ×1 IMPLANT
BAG DRAIN CYSTO-URO LG1000N (MISCELLANEOUS) ×2 IMPLANT
BAG PRESSURE INF REUSE 3000 (BAG) ×2 IMPLANT
BRUSH SCRUB EZ 1% IODOPHOR (MISCELLANEOUS) ×2 IMPLANT
CATH URET FLEX-TIP 2 LUMEN 10F (CATHETERS) IMPLANT
CATH URETL OPEN 5X70 (CATHETERS) IMPLANT
CNTNR SPEC 2.5X3XGRAD LEK (MISCELLANEOUS)
CONT SPEC 4OZ STER OR WHT (MISCELLANEOUS)
CONT SPEC 4OZ STRL OR WHT (MISCELLANEOUS)
CONTAINER SPEC 2.5X3XGRAD LEK (MISCELLANEOUS) IMPLANT
DRAPE UTILITY 15X26 TOWEL STRL (DRAPES) ×2 IMPLANT
DRSG TEGADERM 2-3/8X2-3/4 SM (GAUZE/BANDAGES/DRESSINGS) ×1 IMPLANT
FIBER LASER MOSES 200 DFL (Laser) ×1 IMPLANT
GLOVE SURG UNDER POLY LF SZ7.5 (GLOVE) ×2 IMPLANT
GOWN STRL REUS W/ TWL LRG LVL3 (GOWN DISPOSABLE) ×1 IMPLANT
GOWN STRL REUS W/ TWL XL LVL3 (GOWN DISPOSABLE) ×1 IMPLANT
GOWN STRL REUS W/TWL LRG LVL3 (GOWN DISPOSABLE) ×2
GOWN STRL REUS W/TWL XL LVL3 (GOWN DISPOSABLE) ×2
GUIDEWIRE STR DUAL SENSOR (WIRE) ×3 IMPLANT
IV NS IRRIG 3000ML ARTHROMATIC (IV SOLUTION) ×2 IMPLANT
KIT TURNOVER CYSTO (KITS) ×2 IMPLANT
PACK CYSTO AR (MISCELLANEOUS) ×2 IMPLANT
SET CYSTO W/LG BORE CLAMP LF (SET/KITS/TRAYS/PACK) ×2 IMPLANT
SHEATH NAVIGATOR HD 12/14X36 (SHEATH) IMPLANT
STENT URET 6FRX24 CONTOUR (STENTS) IMPLANT
STENT URET 6FRX26 CONTOUR (STENTS) ×1 IMPLANT
SURGILUBE 2OZ TUBE FLIPTOP (MISCELLANEOUS) ×2 IMPLANT
SYR 10ML LL (SYRINGE) ×2 IMPLANT
TRACTIP FLEXIVA PULSE ID 200 (Laser) IMPLANT
VALVE UROSEAL ADJ ENDO (VALVE) ×1 IMPLANT
WATER STERILE IRR 500ML POUR (IV SOLUTION) ×2 IMPLANT

## 2022-07-03 NOTE — Op Note (Signed)
Date of procedure: 07/03/22  Preoperative diagnosis:  Left proximal ureteral stone Left renal stone  Postoperative diagnosis:  Same  Procedure: Cystoscopy, left retrograde pyelogram with intraoperative interpretation, left ureteral stent placement Left ureteroscopy, laser lithotripsy of proximal ureteral stone Left ureteroscopy, laser lithotripsy of left renal stone  Surgeon: Legrand Rams, MD  Anesthesia: General  Complications: None  Intraoperative findings:  Normal cystoscopy Left proximal ureteral stone dusted Left lower pole renal stone dusted  EBL: Minimal  Specimens: None  Drains: Left 6 French by 26 cm ureteral stent  Indication: Eunie Lawn is a 61 y.o. patient who previously presented with simultaneous acute cholecystitis and left proximal ureteral stone with abdominal pain and underwent simultaneous cholecystectomy and left ureteral stent placement.  She presents today for definitive management with left ureteroscopy and laser lithotripsy..  After reviewing the management options for treatment, they elected to proceed with the above surgical procedure(s). We have discussed the potential benefits and risks of the procedure, side effects of the proposed treatment, the likelihood of the patient achieving the goals of the procedure, and any potential problems that might occur during the procedure or recuperation. Informed consent has been obtained.  Description of procedure:  The patient was taken to the operating room and general anesthesia was induced. SCDs were placed for DVT prophylaxis. The patient was placed in the dorsal lithotomy position, prepped and draped in the usual sterile fashion, and preoperative antibiotics(Ancef) were administered. A preoperative time-out was performed.   A 21 French rigid cystoscope was used to intubate the urethra and thorough cystoscopy was performed.  The bladder was grossly normal.  A sensor wire was advanced alongside the  left ureteral stent up to the kidney under fluoroscopic vision.  The stent was then grasped and pulled to the meatus, and a second safety wire was advanced through the stent up to the kidney, and the old stent removed.  A single channel digital flexible ureteroscope advanced easily over the wire up to the 18mm stone in the proximal ureter.  A yellow stone was clearly identified and a 200 m laser fiber on settings of 1.0 J and 10 Hz was used to methodically dust the stone.  The digital ureteroscope was then advanced up into the kidney and thorough pyeloscopy was performed.  There was a 4 mm left lower pole stone, and this was dusted on settings of 0.5 J and 80 Hz.  Thorough pyeloscopy showed no other stone fragments.  A retrograde pyelogram was performed from the proximal ureter and showed no extravasation or filling defects.  Careful pullback ureteroscopy showed no residual fragments or ureteral injury.  A 6 French by 26 centimeter ureteral stent was uneventfully placed over the wire fluoroscopically with an excellent curl in the upper pole, as well as in the bladder.  The Dangler was secured to the suprapubic region with Mastisol and Tegaderm.  The bladder was drained and this concluded our procedure.  Disposition: Stable to PACU  Plan: Remove stent at home on Wednesday 07/08/22 Nitrofurantoin prophylaxis with stent in place RTC clinic 6 months KUB prior  Legrand Rams, MD

## 2022-07-03 NOTE — Transfer of Care (Signed)
Immediate Anesthesia Transfer of Care Note  Patient: Lori Banks  Procedure(s) Performed: CYSTOSCOPY/URETEROSCOPY/HOLMIUM LASER/STENT EXCHANGE (Left: Ureter)  Patient Location: PACU  Anesthesia Type:General  Level of Consciousness: awake, drowsy and patient cooperative  Airway & Oxygen Therapy: Patient Spontanous Breathing and Patient connected to face mask oxygen  Post-op Assessment: Report given to RN and Post -op Vital signs reviewed and stable  Post vital signs: Reviewed and stable  Last Vitals:  Vitals Value Taken Time  BP 123/65 07/03/22 1327  Temp 36.4 C 07/03/22 1327  Pulse 77 07/03/22 1330  Resp 17 07/03/22 1330  SpO2 97 % 07/03/22 1330  Vitals shown include unvalidated device data.  Last Pain:  Vitals:   07/03/22 1008  TempSrc: Temporal  PainSc: 0-No pain         Complications: No notable events documented.

## 2022-07-03 NOTE — Anesthesia Preprocedure Evaluation (Signed)
Anesthesia Evaluation  Patient identified by MRN, date of birth, ID band Patient awake    Reviewed: Allergy & Precautions, NPO status , Patient's Chart, lab work & pertinent test results  History of Anesthesia Complications Negative for: history of anesthetic complications  Airway Mallampati: III  TM Distance: >3 FB Neck ROM: full    Dental  (+) Missing, Lower Dentures, Upper Dentures   Pulmonary COPD, former smoker,    Pulmonary exam normal        Cardiovascular negative cardio ROS Normal cardiovascular exam     Neuro/Psych negative neurological ROS  negative psych ROS   GI/Hepatic negative GI ROS, Neg liver ROS,   Endo/Other  negative endocrine ROS  Renal/GU      Musculoskeletal   Abdominal   Peds  Hematology negative hematology ROS (+)   Anesthesia Other Findings Past Medical History: No date: COPD (chronic obstructive pulmonary disease) (HCC) No date: History of kidney stones No date: Kidney stone No date: Osteoporosis No date: Pre-diabetes No date: Rheumatoid arthritis (HCC)  Past Surgical History: No date: APPENDECTOMY No date: CESAREAN SECTION No date: CHOLECYSTECTOMY 06/17/2022: CYSTOSCOPY WITH STENT PLACEMENT; Left     Comment:  Procedure: CYSTOSCOPY WITH STENT PLACEMENT;  Surgeon:               Sondra Come, MD;  Location: [facility] ORS;  Service:               Urology;  Laterality: Left; No date: MULTIPLE TOOTH EXTRACTIONS     Comment:  all teeth have been removed, patient wears dentures  03/09/2022: ORIF ANKLE FRACTURE; Right     Comment:  Procedure: OPEN REDUCTION INTERNAL FIXATION (ORIF) ANKLE              FRACTURE;  Surgeon: Lyndle Herrlich, MD;  Location: [facility]               ORS;  Service: Orthopedics;  Laterality: Right; No date: TUBAL LIGATION  BMI    Body Mass Index: 29.04 kg/m      Reproductive/Obstetrics negative OB ROS                              Anesthesia Physical Anesthesia Plan  ASA: 2  Anesthesia Plan: General ETT   Post-op Pain Management:    Induction: Intravenous  PONV Risk Score and Plan: 2 and Ondansetron, Dexamethasone, Midazolam and Treatment may vary due to age or medical condition  Airway Management Planned: Oral ETT  Additional Equipment:   Intra-op Plan:   Post-operative Plan: Extubation in OR  Informed Consent: I have reviewed the patients History and Physical, chart, labs and discussed the procedure including the risks, benefits and alternatives for the proposed anesthesia with the patient or authorized representative who has indicated his/her understanding and acceptance.     Dental Advisory Given  Plan Discussed with: Anesthesiologist, CRNA and Surgeon  Anesthesia Plan Comments: (Patient consented for risks of anesthesia including but not limited to:  - adverse reactions to medications - damage to eyes, teeth, lips or other oral mucosa - nerve damage due to positioning  - sore throat or hoarseness - Damage to heart, brain, nerves, lungs, other parts of body or loss of life  Patient voiced understanding.)        Anesthesia Quick Evaluation

## 2022-07-03 NOTE — H&P (Signed)
   07/03/22 12:31 PM   Lori Banks [birth year] [id number]  CC: Left ureteral stone  HPI: 61 year old female who previously presented with fever and abdominal pain and was found to have both acute cholecystitis as well as a 6 mm left proximal ureteral stone with hydronephrosis.  She underwent cholecystectomy with general surgery with simultaneous left ureteral stent placement.  Presents today for definitive management.  Has tolerated the stent well, denies any urinary symptoms aside from urgency, denies any fevers or chills.   PMH: Past Medical History:  Diagnosis Date   COPD (chronic obstructive pulmonary disease) (HCC)    History of kidney stones    Kidney stone    Osteoporosis    Pre-diabetes    Rheumatoid arthritis (HCC)     Surgical History: Past Surgical History:  Procedure Laterality Date   APPENDECTOMY     CESAREAN SECTION     CHOLECYSTECTOMY     CYSTOSCOPY WITH STENT PLACEMENT Left 06/17/2022   Procedure: CYSTOSCOPY WITH STENT PLACEMENT;  Surgeon: Sondra Come, MD;  Location: [facility] ORS;  Service: Urology;  Laterality: Left;   MULTIPLE TOOTH EXTRACTIONS     all teeth have been removed, patient wears dentures    ORIF ANKLE FRACTURE Right 03/09/2022   Procedure: OPEN REDUCTION INTERNAL FIXATION (ORIF) ANKLE FRACTURE;  Surgeon: Lyndle Herrlich, MD;  Location: [facility] ORS;  Service: Orthopedics;  Laterality: Right;   TUBAL LIGATION       Family History: Family History  Problem Relation Age of Onset   Diabetes Mother    Osteoarthritis Mother    Heart disease Father    Diabetes Sister    Fibromyalgia Sister    Meniere's disease Brother    Healthy Son    Healthy Son    Seizures Son    Healthy Daughter    Epilepsy Nephew     Social History:  reports that she quit smoking about 3 years ago. Her smoking use included cigarettes. She has never used smokeless tobacco. She reports current alcohol use. She reports current drug use. Drug: Marijuana.  Physical  Exam: BP (!) 113/99   Pulse 69   Temp (!) 97 F (36.1 C) (Temporal)   Resp 16   Wt 81.6 kg   SpO2 97%   BMI 29.04 kg/m    Constitutional:  Alert and oriented, No acute distress. Cardiovascular: Regular rate and rhythm Respiratory: Clear to auscultation bilaterally GI: Abdomen is soft, nontender, nondistended, no abdominal masses   Laboratory Data: Urine culture 06/23/2022 no growth  Assessment & Plan:   61 year old female who previously presented with acute cholecystitis and a 6 mm left proximal ureteral stone and underwent simultaneous laparoscopic cholecystectomy and left ureteral stent placement.  Presents today for definitive ureteroscopy and laser lithotripsy.  We specifically discussed the risks ureteroscopy including bleeding, infection/sepsis, stent related symptoms including flank pain/urgency/frequency/incontinence/dysuria, ureteral injury, inability to access stone, or need for staged or additional procedures.  Left ureteroscopy, laser lithotripsy, stent placement   Legrand Rams, MD 07/03/2022  [facility] [address] [place], [place] [address] [phone]

## 2022-07-03 NOTE — Anesthesia Postprocedure Evaluation (Signed)
Anesthesia Post Note  Patient: Kahlie Deutscher Coppola  Procedure(s) Performed: CYSTOSCOPY/URETEROSCOPY/HOLMIUM LASER/STENT EXCHANGE (Left: Ureter)  Patient location during evaluation: PACU Anesthesia Type: General Level of consciousness: awake and alert Pain management: pain level controlled Vital Signs Assessment: post-procedure vital signs reviewed and stable Respiratory status: spontaneous breathing, nonlabored ventilation, respiratory function stable and patient connected to nasal cannula oxygen Cardiovascular status: blood pressure returned to baseline and stable Postop Assessment: no apparent nausea or vomiting Anesthetic complications: no   No notable events documented.   Last Vitals:  Vitals:   07/03/22 1420 07/03/22 1434  BP: 114/61 110/69  Pulse: 77 72  Resp: 20 18  Temp:  (!) 36.1 C  SpO2: 95% 100%    Last Pain:  Vitals:   07/03/22 1434  TempSrc: Temporal  PainSc:                  Stephanie Coup

## 2022-07-03 NOTE — Discharge Instructions (Signed)
AMBULATORY SURGERY  ?DISCHARGE INSTRUCTIONS ? ? ?The drugs that you were given will stay in your system until tomorrow so for the next 24 hours you should not: ? ?Drive an automobile ?Make any legal decisions ?Drink any alcoholic beverage ? ? ?You may resume regular meals tomorrow.  Today it is better to start with liquids and gradually work up to solid foods. ? ?You may eat anything you prefer, but it is better to start with liquids, then soup and crackers, and gradually work up to solid foods. ? ? ?Please notify your doctor immediately if you have any unusual bleeding, trouble breathing, redness and pain at the surgery site, drainage, fever, or pain not relieved by medication. ? ? ? ?Additional Instructions: ? ? ? ?Please contact your physician with any problems or Same Day Surgery at [phone], Monday through Friday 6 am to 4 pm, or [place] [facility] Main number at [phone].  ?

## 2022-07-03 NOTE — Anesthesia Procedure Notes (Signed)
Procedure Name: Intubation Date/Time: 07/03/2022 12:49 PM  Performed by: Mohammed Kindle, CRNAPre-anesthesia Checklist: Patient identified, Emergency Drugs available, Suction available and Patient being monitored Patient Re-evaluated:Patient Re-evaluated prior to induction Oxygen Delivery Method: Circle system utilized Preoxygenation: Pre-oxygenation with 100% oxygen Induction Type: IV induction Ventilation: Mask ventilation without difficulty Laryngoscope Size: McGraph and 3 Grade View: Grade I Tube type: Oral Tube size: 6.5 mm Number of attempts: 1 Airway Equipment and Method: Stylet and Oral airway Placement Confirmation: ETT inserted through vocal cords under direct vision, positive ETCO2, breath sounds checked- equal and bilateral and CO2 detector Secured at: 21 cm Tube secured with: Tape Dental Injury: Teeth and Oropharynx as per pre-operative assessment

## 2022-07-04 ENCOUNTER — Encounter: Payer: Self-pay | Admitting: Urology

## 2022-07-10 ENCOUNTER — Encounter: Payer: Self-pay | Admitting: Surgery

## 2022-07-10 ENCOUNTER — Ambulatory Visit (INDEPENDENT_AMBULATORY_CARE_PROVIDER_SITE_OTHER): Payer: Medicare (Managed Care) | Admitting: Surgery

## 2022-07-10 VITALS — BP 118/77 | HR 73 | Temp 98.0°F | Ht 66.0 in | Wt 181.0 lb

## 2022-07-10 DIAGNOSIS — Z09 Encounter for follow-up examination after completed treatment for conditions other than malignant neoplasm: Secondary | ICD-10-CM

## 2022-07-10 DIAGNOSIS — K810 Acute cholecystitis: Secondary | ICD-10-CM

## 2022-07-10 NOTE — Patient Instructions (Addendum)
Follow-up with our office as needed.  Please call and ask to speak with a nurse if you develop questions or concerns.   GENERAL POST-OPERATIVE PATIENT INSTRUCTIONS   WOUND CARE INSTRUCTIONS: If the wound becomes bright red and painful or starts to drain infected material that is not clear, please contact your physician immediately.  If the wound is mildly pink and has a thick firm ridge underneath it, this is normal, and is referred to as a healing ridge.  This will resolve over the next 4-6 weeks.  BATHING: You may shower if you have been informed of this by your surgeon. However, Please do not submerge in a tub, hot tub, or pool until incisions are completely sealed or have been told by your surgeon that you may do so.  DIET:  You may eat any foods that you can tolerate.  It is a good idea to eat a high fiber diet and take in plenty of fluids to prevent constipation.  If you do become constipated you may want to take a mild laxative or take ducolax tablets on a daily basis until your bowel habits are regular.  Constipation can be very uncomfortable, along with straining, after recent surgery.  ACTIVITY: You should not lift more than 20 pounds for 6 weeks total after surgery as it could put you at increased risk for complications.  Twenty pounds is roughly equivalent to a plastic bag of groceries. At that time- Listen to your body when lifting, if you have pain when lifting, stop and then try again in a few days. Soreness after doing exercises or activities of daily living is normal as you get back in to your normal routine.  MEDICATIONS:  Try to take narcotic medications and anti-inflammatory medications, such as tylenol, ibuprofen, naprosyn, etc., with food.  This will minimize stomach upset from the medication.  Should you develop nausea and vomiting from the pain medication, or develop a rash, please discontinue the medication and contact your physician.  You should not drive, make important  decisions, or operate machinery when taking narcotic pain medication.  SUNBLOCK Use sun block to incision area over the next year if this area will be exposed to sun. This helps decrease scarring and will allow you avoid a permanent darkened area over your incision.  QUESTIONS:  Please feel free to call our office if you have any questions, and we will be glad to assist you. [phone]   

## 2022-07-11 ENCOUNTER — Encounter: Payer: Self-pay | Admitting: Surgery

## 2022-07-11 NOTE — Progress Notes (Signed)
07/10/2022  HPI: Lori Banks is a 61 y.o. female s/p robotic assisted cholecystectomy on 06/17/22.  She also had a concurrent cystoscopy with left ureteral stent placement.  Stent has since been removed.  Patient reports that today she's doing much better than our last visit.  She's tolerating a diet, having bowel function, no significant pain.  Vital signs: BP 118/77   Pulse 73   Temp 98 F (36.7 C)   Ht 5\' 6"  (1.676 m)   Wt 181 lb (82.1 kg)   SpO2 97%   BMI 29.21 kg/m    Physical Exam: Constitutional: No acute distress Abdomen:  soft, non-distended, non-tender to palpation.  Incisions are clean, dry, intact.  Remaining dermabond removed.  Assessment/Plan: This is a 61 y.o. female s/p robotic assisted cholecystectomy.  --Patient has been doing well and without any complications.  Discussed activity precautions, when to resume her RA medications. --Follow up as needed.   67, MD [facility]

## 2022-07-28 ENCOUNTER — Other Ambulatory Visit: Payer: Self-pay

## 2022-07-28 ENCOUNTER — Emergency Department
Admission: EM | Admit: 2022-07-28 | Discharge: 2022-07-29 | Disposition: A | Discharge status: Home / Self Care | Payer: Medicare (Managed Care) | Attending: Emergency Medicine | Admitting: Emergency Medicine

## 2022-07-28 ENCOUNTER — Emergency Department: Payer: Medicare (Managed Care)

## 2022-07-28 DIAGNOSIS — R1013 Epigastric pain: Secondary | ICD-10-CM | POA: Diagnosis not present

## 2022-07-28 DIAGNOSIS — R142 Eructation: Secondary | ICD-10-CM | POA: Diagnosis not present

## 2022-07-28 DIAGNOSIS — J449 Chronic obstructive pulmonary disease, unspecified: Secondary | ICD-10-CM | POA: Diagnosis not present

## 2022-07-28 DIAGNOSIS — R0781 Pleurodynia: Secondary | ICD-10-CM | POA: Diagnosis not present

## 2022-07-28 DIAGNOSIS — R110 Nausea: Secondary | ICD-10-CM | POA: Insufficient documentation

## 2022-07-28 DIAGNOSIS — R7401 Elevation of levels of liver transaminase levels: Secondary | ICD-10-CM | POA: Insufficient documentation

## 2022-07-28 DIAGNOSIS — R1010 Upper abdominal pain, unspecified: Secondary | ICD-10-CM | POA: Diagnosis present

## 2022-07-28 LAB — CBC WITH DIFFERENTIAL/PLATELET
Abs Immature Granulocytes: 0.02 10*3/uL (ref 0.00–0.07)
Basophils Absolute: 0 10*3/uL (ref 0.0–0.1)
Basophils Relative: 0 %
Eosinophils Absolute: 0.1 10*3/uL (ref 0.0–0.5)
Eosinophils Relative: 2 %
HCT: 35.3 % — ABNORMAL LOW (ref 36.0–46.0)
Hemoglobin: 11.4 g/dL — ABNORMAL LOW (ref 12.0–15.0)
Immature Granulocytes: 0 %
Lymphocytes Relative: 38 %
Lymphs Abs: 2.3 10*3/uL (ref 0.7–4.0)
MCH: 30.2 pg (ref 26.0–34.0)
MCHC: 32.3 g/dL (ref 30.0–36.0)
MCV: 93.6 fL (ref 80.0–100.0)
Monocytes Absolute: 0.4 10*3/uL (ref 0.1–1.0)
Monocytes Relative: 7 %
Neutro Abs: 3.2 10*3/uL (ref 1.7–7.7)
Neutrophils Relative %: 53 %
Platelets: 224 10*3/uL (ref 150–400)
RBC: 3.77 MIL/uL — ABNORMAL LOW (ref 3.87–5.11)
RDW: 13.8 % (ref 11.5–15.5)
WBC: 6.1 10*3/uL (ref 4.0–10.5)
nRBC: 0 % (ref 0.0–0.2)

## 2022-07-28 LAB — COMPREHENSIVE METABOLIC PANEL
ALT: 74 U/L — ABNORMAL HIGH (ref 0–44)
AST: 170 U/L — ABNORMAL HIGH (ref 15–41)
Albumin: 3.7 g/dL (ref 3.5–5.0)
Alkaline Phosphatase: 59 U/L (ref 38–126)
Anion gap: 5 (ref 5–15)
BUN: 32 mg/dL — ABNORMAL HIGH (ref 8–23)
CO2: 26 mmol/L (ref 22–32)
Calcium: 9.5 mg/dL (ref 8.9–10.3)
Chloride: 109 mmol/L (ref 98–111)
Creatinine, Ser: 0.91 mg/dL (ref 0.44–1.00)
GFR, Estimated: >60 mL/min (ref >60)
Glucose, Bld: 129 mg/dL — ABNORMAL HIGH (ref 70–99)
Potassium: 3.3 mmol/L — ABNORMAL LOW (ref 3.5–5.1)
Sodium: 140 mmol/L (ref 135–145)
Total Bilirubin: 0.2 mg/dL — ABNORMAL LOW (ref 0.3–1.2)
Total Protein: 6.3 g/dL — ABNORMAL LOW (ref 6.5–8.1)

## 2022-07-28 LAB — LIPASE, BLOOD: Lipase: 43 U/L (ref 11–51)

## 2022-07-28 LAB — TROPONIN I (HIGH SENSITIVITY)
Troponin I (High Sensitivity): 2 ng/L (ref <18)
Troponin I (High Sensitivity): 3 ng/L (ref <18)

## 2022-07-28 MED ORDER — MORPHINE SULFATE (PF) 4 MG/ML IV SOLN
4.0000 mg | Freq: Once | INTRAVENOUS | Status: AC
  Administered 2022-07-28: 4 mg via INTRAVENOUS
  Filled 2022-07-28: qty 1

## 2022-07-28 MED ORDER — ALUM & MAG HYDROXIDE-SIMETH 200-200-20 MG/5ML PO SUSP
ORAL | Status: AC
  Filled 2022-07-28: qty 30

## 2022-07-28 MED ORDER — ONDANSETRON HCL 4 MG/2ML IJ SOLN
4.0000 mg | Freq: Once | INTRAMUSCULAR | Status: AC
  Administered 2022-07-28: 4 mg via INTRAVENOUS
  Filled 2022-07-28: qty 2

## 2022-07-28 MED ORDER — IOHEXOL 300 MG/ML  SOLN
100.0000 mL | Freq: Once | INTRAMUSCULAR | Status: AC | PRN
Start: 2022-07-28 — End: 2022-07-28
  Administered 2022-07-28: 100 mL via INTRAVENOUS

## 2022-07-28 MED ORDER — ALUM & MAG HYDROXIDE-SIMETH 200-200-20 MG/5ML PO SUSP
30.0000 mL | Freq: Once | ORAL | Status: AC
  Administered 2022-07-28: 30 mL via ORAL

## 2022-07-28 MED ORDER — SODIUM CHLORIDE 0.9 % IV BOLUS
1000.0000 mL | Freq: Once | INTRAVENOUS | Status: AC
  Administered 2022-07-28: 1000 mL via INTRAVENOUS

## 2022-07-28 NOTE — Discharge Instructions (Addendum)
Your blood work and CAT scan were reassuring today.  If you have recurrent abdominal or chest pain that is worsening or you are unable to eat or drink please return to the emergency department.

## 2022-07-28 NOTE — ED Triage Notes (Signed)
Substernal chest pain after drinking soda

## 2022-07-29 NOTE — ED Provider Notes (Signed)
[facility] Provider Note    Event Date/Time   First MD Initiated Contact with Patient 07/28/22 2056     (approximate)   History   Chest Pain   HPI  Lori Banks is a 61 y.o. female past medical history of rheumatoid arthritis, appendectomy cholecystectomy presents with abdominal pain.  Symptoms started acutely just prior to arrival.  She drank Dr. Reino Kent and suddenly had pain in the upper abdomen in the bilateral ribs associated nausea and no vomiting.  Did have a bowel movement afterwards which did not seem to help.  Has had multiple episodes of belching and some regurgitation.  He denies any shortness of breath denies history of similar no history of cardiac disease.    Past Medical History:  Diagnosis Date   COPD (chronic obstructive pulmonary disease) (HCC)    History of kidney stones    Kidney stone    Osteoporosis    Pre-diabetes    Rheumatoid arthritis (HCC)     Patient Active Problem List   Diagnosis Date Noted   Acute cholecystitis 06/17/2022   Acute unilateral obstructive uropathy 06/17/2022   Chronic obstructive pulmonary disease (COPD) (HCC) 06/17/2022   Dyslipidemia 06/17/2022   Rheumatoid arthritis involving multiple sites (HCC) 12/24/2020   Prediabetes 12/24/2020     Physical Exam  Triage Vital Signs: ED Triage Vitals  Enc Vitals Group     BP 07/28/22 2049 (!) 92/51     Pulse Rate 07/28/22 2049 (!) 51     Resp 07/28/22 2049 16     Temp 07/28/22 2049 97.9 F (36.6 C)     Temp Source 07/28/22 2049 Oral     SpO2 07/28/22 2049 98 %     Weight 07/28/22 2050 183 lb (83 kg)     Height 07/28/22 2050 5\' 6"  (1.676 m)     Head Circumference --      Peak Flow --      Pain Score 07/28/22 2050 7     Pain Loc --      Pain Edu? --      Excl. in GC? --     Most recent vital signs: Vitals:   07/28/22 2300 07/28/22 2330  BP: 109/82 112/63  Pulse: 73 71  Resp: 20 16  Temp:    SpO2: 98% 98%     General: Awake,  patient looks uncomfortable CV:  Good peripheral perfusion.  Resp:  Normal effort.  Abd:  No distention.  Tenderness to palpation in the epigastric region with voluntary guarding Neuro:             Awake, Alert, Oriented x 3  Other:     ED Results / Procedures / Treatments  Labs (all labs ordered are listed, but only abnormal results are displayed) Labs Reviewed  COMPREHENSIVE METABOLIC PANEL - Abnormal; Notable for the following components:      Result Value   Potassium 3.3 (*)    Glucose, Bld 129 (*)    BUN 32 (*)    Total Protein 6.3 (*)    AST 170 (*)    ALT 74 (*)    Total Bilirubin 0.2 (*)    All other components within normal limits  CBC WITH DIFFERENTIAL/PLATELET - Abnormal; Notable for the following components:   RBC 3.77 (*)    Hemoglobin 11.4 (*)    HCT 35.3 (*)    All other components within normal limits  LIPASE, BLOOD  TROPONIN I (HIGH SENSITIVITY)  TROPONIN I (  HIGH SENSITIVITY)     EKG  EKG interpreted myself shows normal sinus rhythm with a right axis deviation low voltage no acute ischemic changes   RADIOLOGY Reviewed and interpreted the CT abdomen pelvis which is negative for acute abnormality   PROCEDURES:  Critical Care performed: No  Procedures  The patient is on the cardiac monitor to evaluate for evidence of arrhythmia and/or significant heart rate changes.   MEDICATIONS ORDERED IN ED: Medications  alum & mag hydroxide-simeth (MAALOX/MYLANTA) 200-200-20 MG/5ML suspension (has no administration in time range)  morphine (PF) 4 MG/ML injection 4 mg (4 mg Intravenous Given 07/28/22 2115)  ondansetron (ZOFRAN) injection 4 mg (4 mg Intravenous Given 07/28/22 2108)  sodium chloride 0.9 % bolus 1,000 mL (0 mLs Intravenous Stopped 07/28/22 2303)  iohexol (OMNIPAQUE) 300 MG/ML solution 100 mL (100 mLs Intravenous Contrast Given 07/28/22 2157)  alum & mag hydroxide-simeth (MAALOX/MYLANTA) 200-200-20 MG/5ML suspension 30 mL (30 mLs Oral Given 07/28/22  2357)     IMPRESSION / MDM / ASSESSMENT AND PLAN / ED COURSE  I reviewed the triage vital signs and the nursing notes.                              Patient's presentation is most consistent with acute presentation with potential threat to life or bodily function.  Differential diagnosis includes, but is not limited to, perforated ulcer, pancreatitis, choledocholithiasis, reflux, AAA, dissection, acute coronary syndrome  The patient is a 61 year old female presenting with upper abdominal pain that started acutely about an hour prior to presentation.  She was overall feeling okay drank some Dr. Reino Kent and then all of a sudden had upper abdominal pain rating to the bilateral flank regions.  Initially she is hypotensive she received about a liter and half of fluid and blood pressure normalized she looks uncomfortable on my initial assessment, and has tenderness primarily in the upper abdomen with some voluntary guarding.  Initial EKG was significant baseline artifact, on repeat no obvious acute ischemic changes.  Patient given morphine and Zofran after which she is feeling significantly improved.  Chest x-ray without any acute process and CT abdomen pelvis also was negative for acute abnormality.  Patient does note that she has been belching some and having some regurgitation question whether this is reflux or gastritis type pain.  Patient able to take p.o. in the ED.  Labs overall reassuring, does have mild elevation in AST and ALT no leukocytosis negative troponins x2.  Similar with negative imaging reassuring labs and patient feeling improved I think she is appropriate for discharge.  We discussed return precautions.       FINAL CLINICAL IMPRESSION(S) / ED DIAGNOSES   Final diagnoses:  Epigastric pain     Rx / DC Orders   ED Discharge Orders     None        Note:  This document was prepared using Dragon voice recognition software and may include unintentional dictation errors.    Georga Hacking, MD 07/29/22 [phone]

## 2022-07-29 NOTE — ED Notes (Signed)
Patient discharged at this time. Ambulated to lobby with independent and steady gait. Breathing unlabored speaking in full sentences. Verbalized understanding of all discharge, follow up, and medication teaching. Discharged homed with all belongings.   

## 2022-11-28 IMAGING — DX DG ANKLE COMPLETE 3+V*R*
3 series · 3 of 3 positions shown · non-contrast
Comparison: Right foot radiographs 01/22/2021.

CLINICAL DATA: Right ankle pain and deformity after fall. Right
proximal tibial pain. Fell in bathroom.

EXAM:
RIGHT ANKLE - COMPLETE 3+ VIEW; RIGHT TIBIA AND FIBULA - 2 VIEW

[ankle ap]
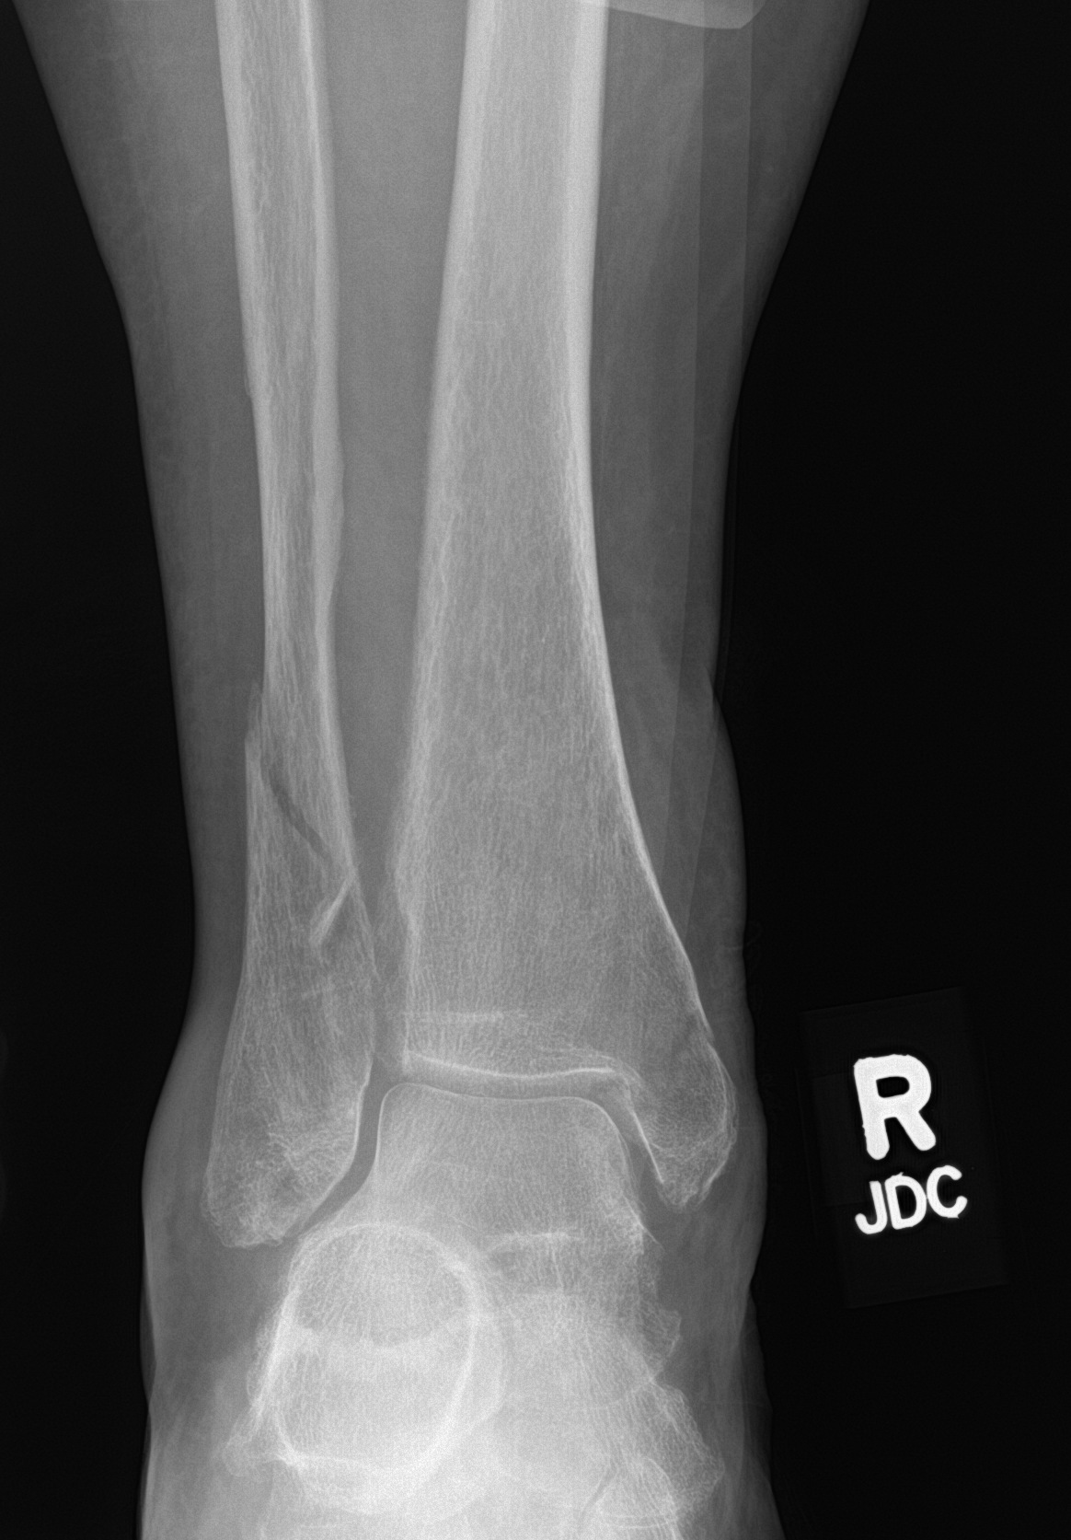

[ankle obl]
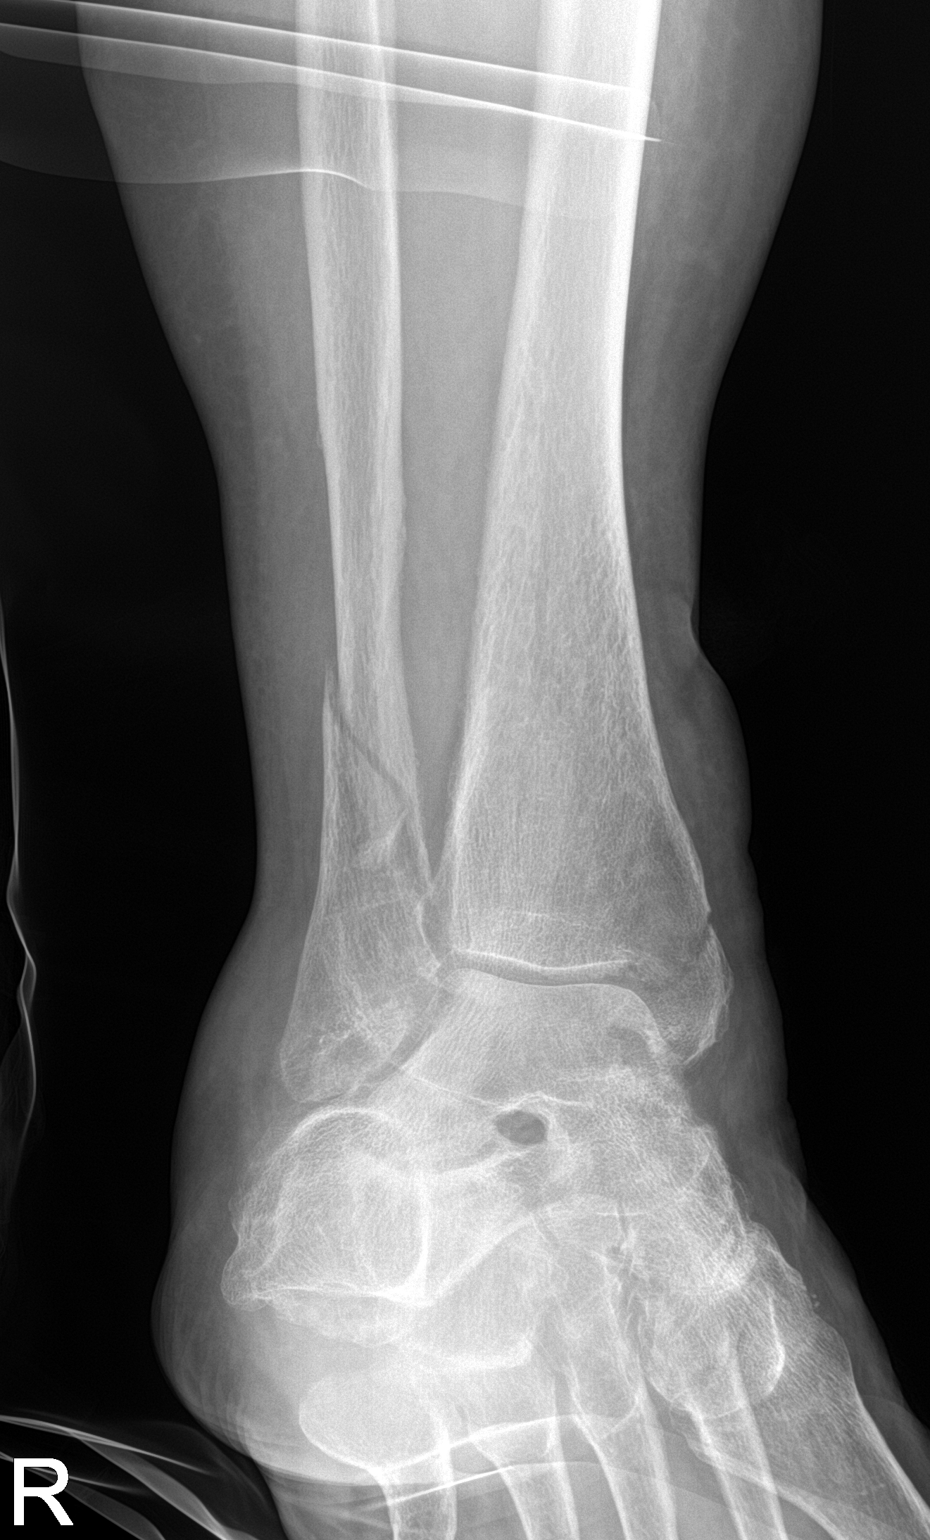

[ankle lat]
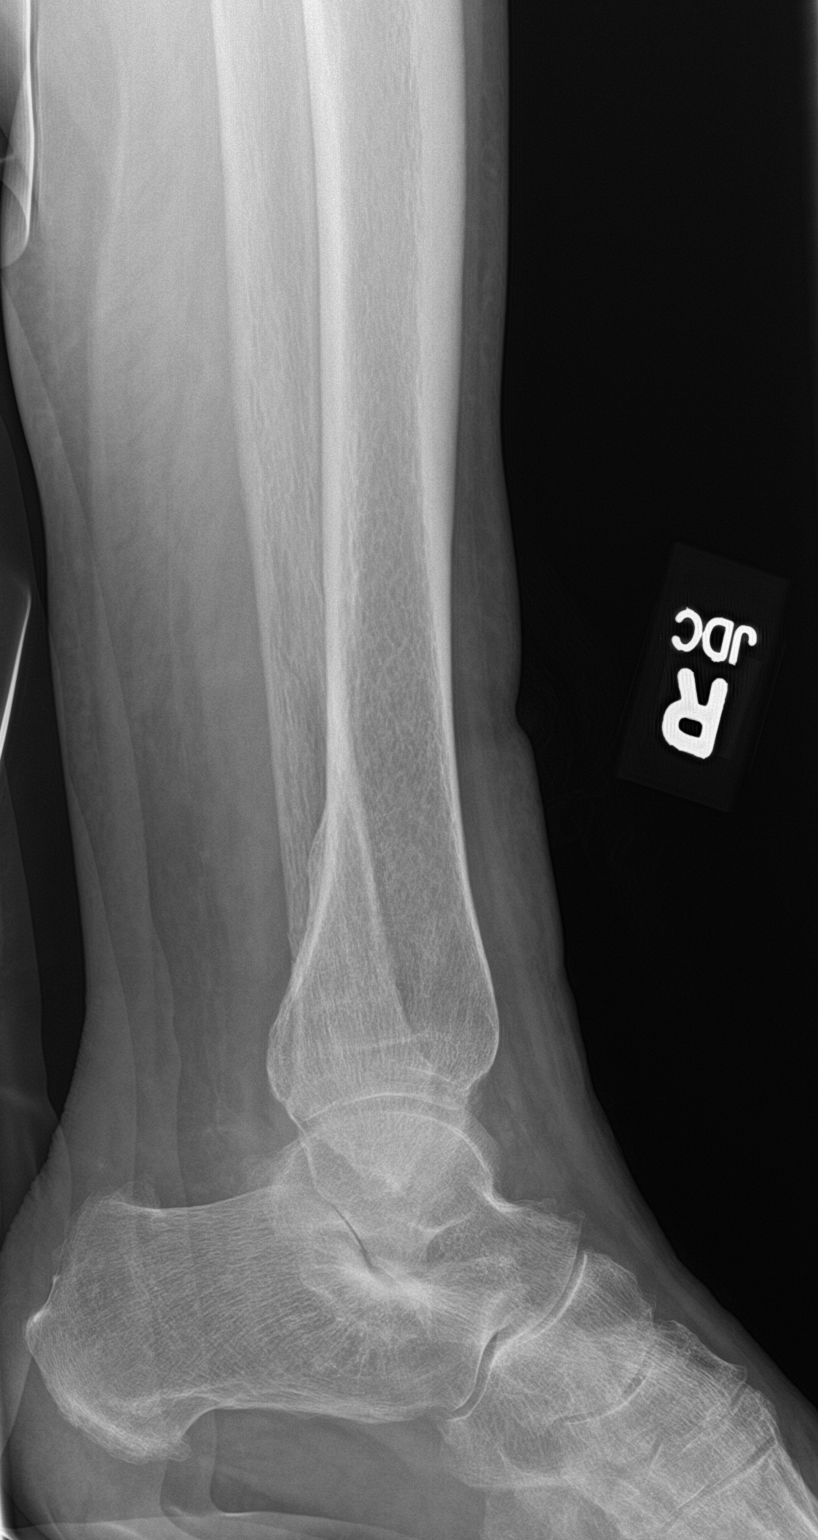

[3 of 3 positions shown; findings below may reference images not displayed]

FINDINGS: There is an oblique spiral fracture of the distal fibular diaphysis
extending to the metaphysis. Additional oblique linear nondisplaced
fracture within the superior aspect of the medial malleolus. The
ankle mortise remains symmetric and intact. Mild-to-moderate distal
calf and lateral malleolar soft tissue swelling. Small calcaneal
heel spur. No dislocation.

No additional fracture is seen within the more proximal tibia or
fibula.
IMPRESSION: Acute fractures of the distal fibula and medial malleolus.

## 2022-11-28 IMAGING — DX DG TIBIA/FIBULA 2V*R*
3 series · 3 of 3 positions shown · non-contrast
Comparison: Right foot radiographs 01/22/2021.

CLINICAL DATA: Right ankle pain and deformity after fall. Right
proximal tibial pain. Fell in bathroom.

EXAM:
RIGHT ANKLE - COMPLETE 3+ VIEW; RIGHT TIBIA AND FIBULA - 2 VIEW

[tibia ap]
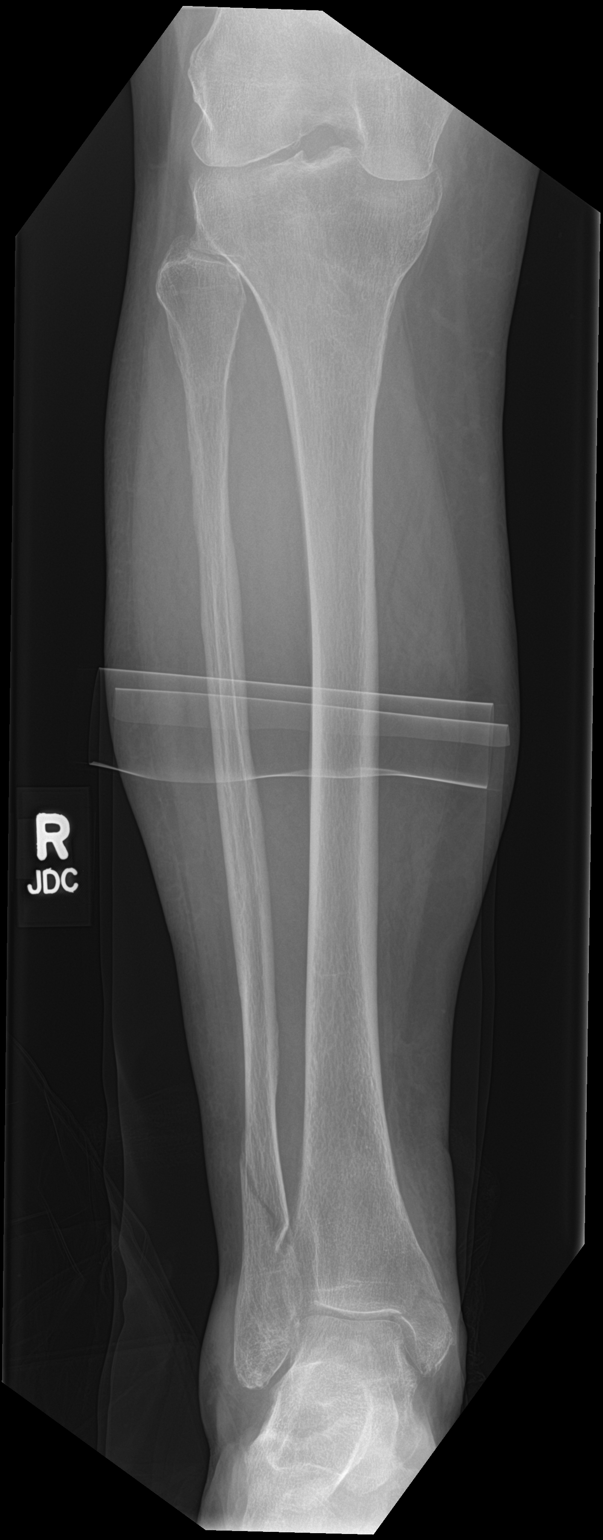

[tibia lat (1 of 2)]
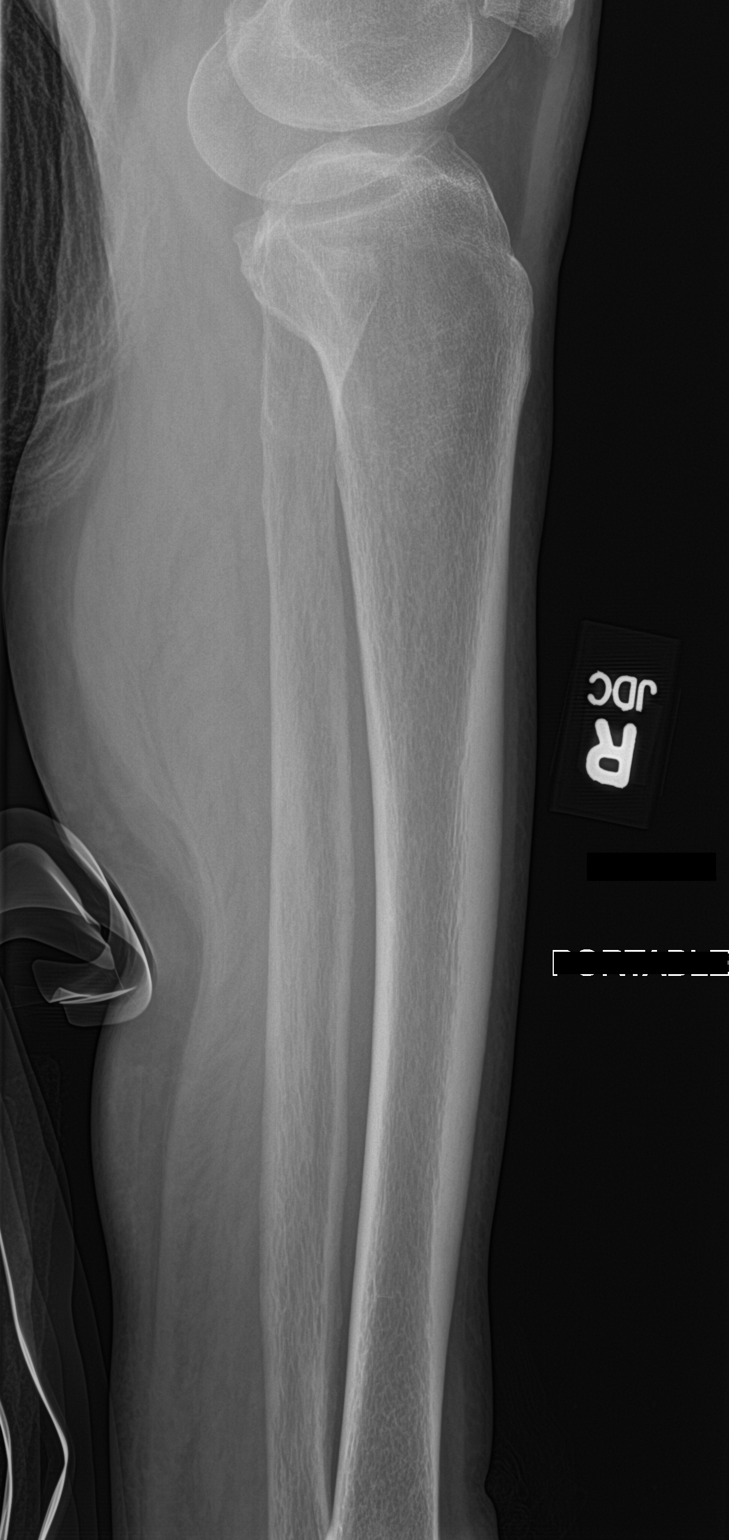

[tibia lat (2 of 2)]
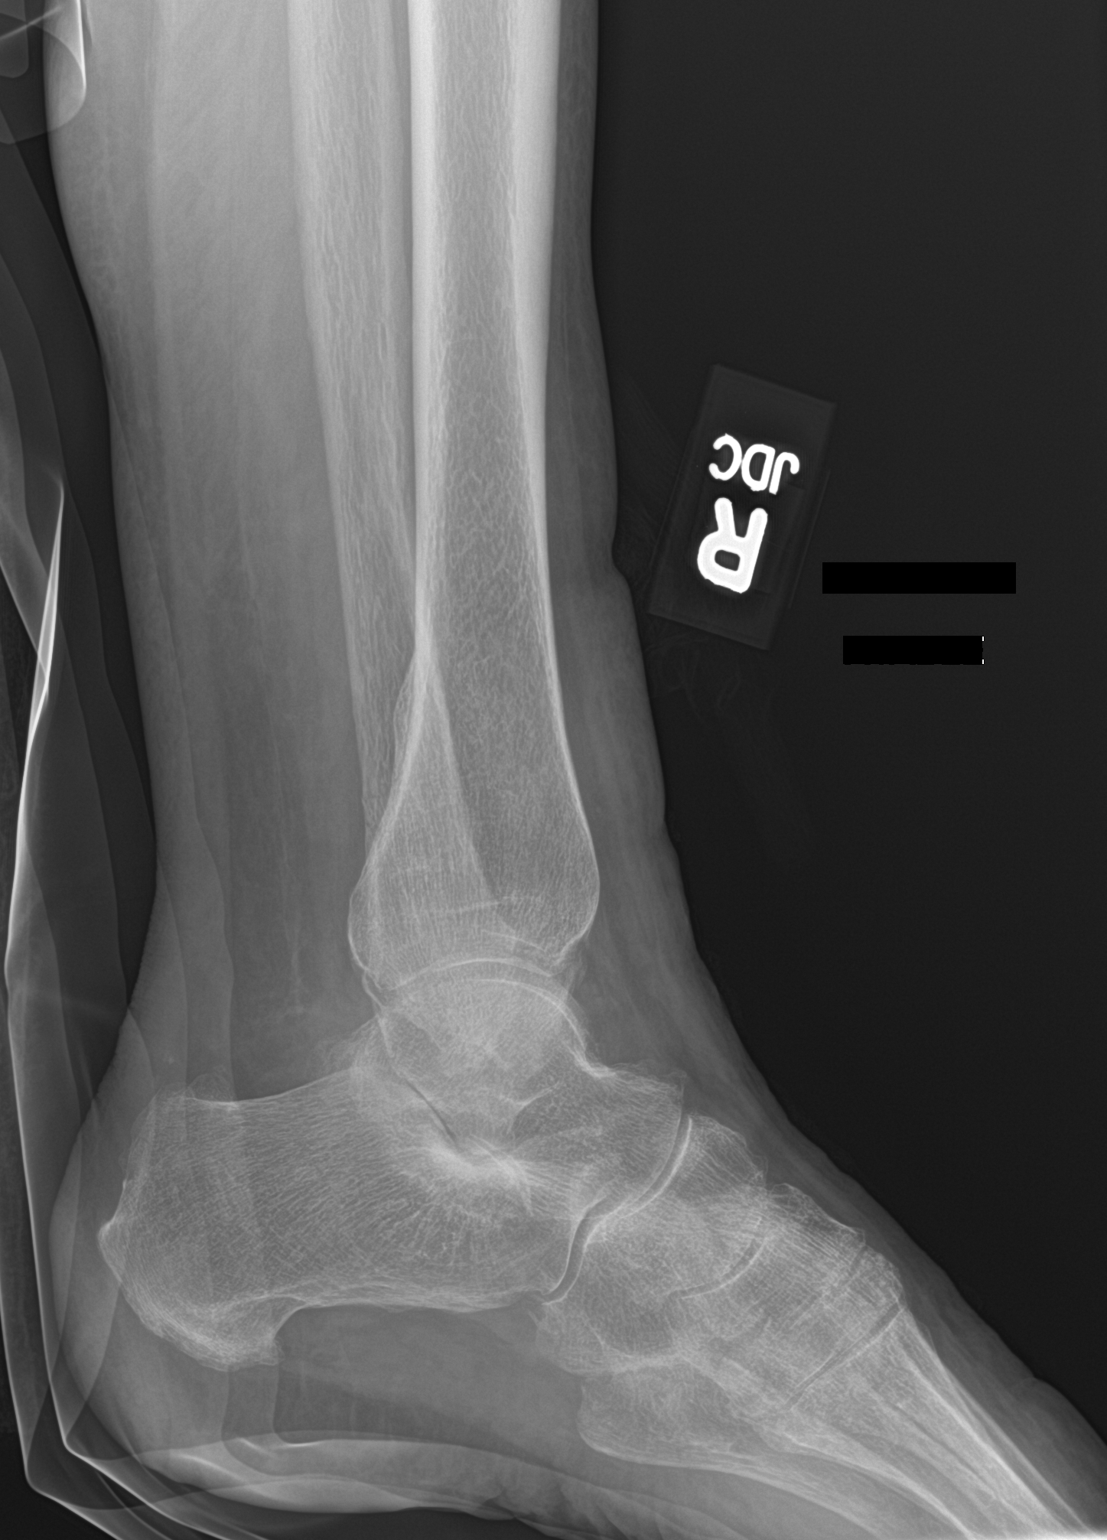

[3 of 3 positions shown; findings below may reference images not displayed]

FINDINGS: There is an oblique spiral fracture of the distal fibular diaphysis
extending to the metaphysis. Additional oblique linear nondisplaced
fracture within the superior aspect of the medial malleolus. The
ankle mortise remains symmetric and intact. Mild-to-moderate distal
calf and lateral malleolar soft tissue swelling. Small calcaneal
heel spur. No dislocation.

No additional fracture is seen within the more proximal tibia or
fibula.
IMPRESSION: Acute fractures of the distal fibula and medial malleolus.

## 2022-12-14 ENCOUNTER — Other Ambulatory Visit: Payer: Self-pay | Admitting: Urology

## 2023-01-06 ENCOUNTER — Other Ambulatory Visit: Payer: Self-pay | Admitting: Urology

## 2023-01-06 ENCOUNTER — Encounter: Payer: Self-pay | Admitting: Urology

## 2023-01-06 ENCOUNTER — Ambulatory Visit
Admission: RE | Admit: 2023-01-06 | Discharge: 2023-01-06 | Disposition: A | Discharge status: Home / Self Care | Payer: Medicare (Managed Care) | Attending: Urology | Admitting: Urology

## 2023-01-06 ENCOUNTER — Ambulatory Visit (INDEPENDENT_AMBULATORY_CARE_PROVIDER_SITE_OTHER): Payer: Medicare (Managed Care) | Admitting: Urology

## 2023-01-06 ENCOUNTER — Ambulatory Visit
Admission: RE | Admit: 2023-01-06 | Discharge: 2023-01-06 | Disposition: A | Discharge status: Home / Self Care | Payer: Medicare (Managed Care) | Source: Ambulatory Visit | Attending: Urology | Admitting: Urology

## 2023-01-06 VITALS — BP 123/84 | HR 72 | Ht 66.0 in | Wt 186.0 lb

## 2023-01-06 DIAGNOSIS — N200 Calculus of kidney: Secondary | ICD-10-CM

## 2023-01-06 NOTE — Progress Notes (Signed)
   01/06/2023 1:45 PM   Jonne Rote [facility] July 04, 1961 [id number]  Reason for visit: Follow up nephrolithiasis  HPI: 62 year old female who originally presented on 06/17/2022 with severe abdominal pain and fever and was found to have both acute cholecystitis as well as a 6 mm left proximal ureteral stone.  She underwent robotic cholecystectomy with general surgery with simultaneous left ureteral stent placement at that time.  She then underwent definitive left ureteroscopy on 07/03/2022, and removed her stent at home.  She has been doing well since that time and denies any flank pain or kidney stone episodes.  She was seen in the ER in mid August, and I personally viewed and interpreted the CT at that time that shows no ureteral stones, no hydronephrosis, and punctate left lower pole possible renal stone.  I reviewed the KUB today that shows no evidence of stone disease.  We discussed general stone prevention strategies including adequate hydration with goal of producing 2.5 L of urine daily, increasing citric acid intake, increasing calcium intake during high oxalate meals, minimizing animal protein, and decreasing salt intake. Information about dietary recommendations given today.   She prefers follow-up as needed   [place], [place] [address], [place], [place] [address] [phone]

## 2023-01-06 NOTE — Patient Instructions (Signed)

## 2023-01-14 ENCOUNTER — Encounter: Payer: Self-pay | Admitting: Surgery

## 2023-11-15 LAB — HM PAP SMEAR: HM Pap smear: NORMAL

## 2023-11-15 LAB — RESULTS CONSOLE HPV: CHL HPV: NEGATIVE

## 2023-12-03 ENCOUNTER — Encounter: Payer: Medicaid Other | Admitting: Advanced Practice Midwife

## 2024-01-18 NOTE — Progress Notes (Signed)
 Chief Complaint  Patient presents with  . Rheumatoid Arthritis    History of present illness:    Lori Banks is a 63 y.o.female who presents today for follow-up evaluation of active severe Stage 3 rheumatoid arthritis with positive rheumatoid factor. She was last seen 10/07/2023 and was doing worse on methotrexate  17.5mg  weekly and folic acid  2mg  daily and xeljanz  and sulfasalazine 1000mg  bid. We assessed for a flare, and issued a prednisone  taper with plans to replace xeljanz  if prednisone  failed to regain control.   On 10/21/23 she sent a message that the prednisone  did not improve her pains meaningfully.    She last got hand x-rays completed 01/2021. She last completed DEXA scan 06/2022.   Lori Banks was diagnosed with rheumatoid arthritis with positive rheumatoid factor in 2001 by rheumatology in [place] . Diagnosis was supported by erosions on imaging.   Lori Banks has tried the following rheumatologic medications in the past: - Plaquenil - lack of efficacy, concerns about eye issues - Remicade - she took this for three years, and feels it was effective but stopped due to cost - Orencia - took only once but did not continue because of cost and hesitance on injections - Methotrexate  - current - Xeljanz  - current - Sulfasalazine - current   She is doing better than average today, but poorly overall since last visit. She notes ongoing pains in her hands and wrists and feet. Activity relieves the pain. Rest makes the pain worse.  She has morning stiffness lasting 15-30 minutes.     She notes she was recently sick with what she thought was the flu, but was never formally tested for. She had to miss some xeljanz  as a result.    Lori Banks is currently being treated with xeljanz  and methotrexate  17.5mg  weekly and folic acid  2mg  daily and sulfasalazine 1000mg  bid.  She has not experienced side effects from the medication.  She has not had any infections since last office visit.      She has not had any other significant health changes or unexplained new symptoms since last visit.    Lori Banks work status is on disability since 2013 for her RA. She is a former smoker who smokes 1 ppd for 46 years, for a total of 46 pack years. She quit smoking in 2020. She drinks an average of 0 servings of alcohol weekly. Lori Banks reports a family history of Meniere's disease in her brother, fibromyalgia in her sisters, but denies any family history of known autoimmune disease otherwise.    Review of Systems ROS was negative except as noted above.  Patient Active Problem List   Diagnosis Date Noted  . Rheumatoid arthritis involving multiple sites with positive rheumatoid factor (CMS/HHS-HCC) 06/09/2022    Past Medical History:  Diagnosis Date  . Rheumatoid arthritis (CMS/HHS-HCC)    Past Surgical History:  Procedure Laterality Date  . surgery for broken leg Right 2023  . APPENDECTOMY    . CESAREAN SECTION    . EXTRACTION TEETH    . TUBAL LIGATION     Social History   Socioeconomic History  . Marital status: Legally Separated  Tobacco Use  . Smoking status: Former  Advertising account planner  . Vaping status: Never Used  Substance and Sexual Activity  . Alcohol use: Yes    Comment: social drinker  . Drug use: Yes    Comment: smokes marijuana   Social Drivers of Health   Housing Stability: Unknown (01/17/2024)   Housing Stability Vital Sign   .  Homeless in the Last Year: No   Family History  Problem Relation Name Age of Onset  . Diabetes Mother    . Heart failure Father    . High blood pressure (Hypertension) Father      Physical Exam: BP 124/70   Temp 36.4 C (97.5 F) (Temporal)   Ht 167.6 cm (5' 6)   Wt 93.9 kg (207 lb)   BMI 33.41 kg/m   General: Pleasant older white woman sitting in chair. Well groomed, no acute distress. Non-toxic appearance.  HEENT: Conjunctivae normal.  Pulmonary: Normal effort of breathing. Symmetric chest expansion.  Musculoskeletal: Tenderness  to palpation of MCPs bilaterally, MTPs bilaterally. Synovitis noted to S1+ superimposed on chronic synovitic changes to bilateral 2nd-3rd MCPs. Fist formation mildly impaired bilaterally. Positive squeeze tests to hands and feet. No other tender, synovitic, warm, erythematous, or deformed joints. FROM all joints except as above.  Skin: Skin warm and dry. No rashes appreciable. Neurologic: Oriented to time, person, place, and situation. Normal gait.  Psychological: Normal behavior, thought content, and judgment. Records were reviewed No results found for: RF, CCP Lab Results  Component Value Date/Time   SEDRATE 22 10/07/2023 03:37 PM   Lab Results  Component Value Date/Time   CREATININE 0.8 10/07/2023 03:37 PM   ALB 4.6 10/07/2023 03:37 PM   ALKPHOS 41 10/07/2023 03:37 PM   AST 22 10/07/2023 03:37 PM   ALT 16 10/07/2023 03:37 PM   Lab Results  Component Value Date/Time   WBC 4.6 01/18/2024 03:08 PM   RBC 3.81 (L) 01/18/2024 03:08 PM   HGB 11.7 (L) 01/18/2024 03:08 PM   HCT 37.3 01/18/2024 03:08 PM   MCV 97.9 01/18/2024 03:08 PM   MCH 30.7 01/18/2024 03:08 PM   MCHC 31.4 (L) 01/18/2024 03:08 PM   PLT 208 01/18/2024 03:08 PM    Failed to redirect to the Timeline version of the REVFS SmartLink. CDAI is calculated as follows:  SDAI = SJC + TJC +PGA + EGA Where: 5 + 11 + 8 + 5 = 29  SJC = Swollen Joint Count TJC = Tender Joint Count PGA = Patient Global Assessment of Disease Activity EGA = Evaluator Global Assessment of Disease Activity  Interpretation <=2.8: Remission >2.8 and <=10: Low Disease Activity >10 and <=22: Moderate Disease Activity >22: High Disease Activity  Assessment and Plan: Rheumatoid arthritis involving multiple sites with positive rheumatoid factor (CMS/HHS-HCC)  (primary encounter diagnosis) Plan: CBC w/auto Differential (5 Part), Hepatic        Function Panel (HFP), Creatinine, C-Reactive        Protein, Quant - [facility], Sedimentation         Rate-Automated  Encounter for long-term (current) use of high-risk medication   1. Rheumatoid arthritis-severe Stage 3.  Improved and Poorly controlled.  We will pursue Rinvoq as a replacement for xeljanz , given her loss of disease control, while continuing the remainder of her regimen.  2. High risk prescription-This patient is on drug therapy requiring intensive monitoring for toxicity, including regular lab monitoring and screening for serious or recurrent infections.  Today, I assessed for side effects, infections, new or worsening health conditions that may be related to the medications, and will obtain labs.  Return in about 3 months (around 04/16/2024).     Medication List       * Accurate as of January 18, 2024  4:26 PM. If you have any questions, ask your nurse or doctor.          CONTINUE  taking these medications    alendronate 70 MG tablet Commonly known as: FOSAMAX Take 1 tablet (70 mg total) by mouth every 7 (seven) days WITH A FULL GLASS OF WATER DO NOT LIE DOWN FOR THE NEXT 30 MINUTES   atorvastatin  10 MG tablet Commonly known as: LIPITOR   cholecalciferol 1,250 mcg (50,000 unit) capsule Commonly known as: VITAMIN D3   diclofenac  1 % topical gel Commonly known as: VOLTAREN    folic acid  1 MG tablet Commonly known as: FOLVITE  Take 2 tablets (2 mg total) by mouth once daily for 360 days   * methotrexate  2.5 MG tablet Commonly known as: RHEUMATREX TAKE 7 TABLETS (2.5 MG) BY MOUTH ONCE A WEEK. TOTAL 17.5MG    * methotrexate  2.5 MG tablet Commonly known as: RHEUMATREX Take 7 tablets (17.5 mg total) by mouth every 7 (seven) days   MULTIVITAMIN ORAL   sulfaSALAzine 500 mg EC tablet Commonly known as: AZULFIDINE EN-TABS Take 2 tablets (1,000 mg total) by mouth 2 (two) times daily Take with food.   * tofacitinib  10 mg immediate release tablet Commonly known as: XELJANZ  Take 1 tablet (10 mg total) by mouth once daily   * tofacitinib  5 mg immediate release  tablet Commonly known as: XELJANZ  Take 1 tablet (5 mg total) by mouth 2 (two) times daily as directed      * * This list has 4 medication(s) that are the same as other medications prescribed for you. Read the directions carefully, and ask your doctor or other care provider to review them with you.          Orders Placed This Encounter  Procedures  . CBC w/auto Differential (5 Part)  . Hepatic Function Panel (HFP)  . Creatinine  . C-Reactive Protein, Quant - [facility]  . Sedimentation Rate-Automated    All new prescription medications, changes in current prescription dosages, and sample medications were discussed with the patient, including patient education, medication name, use, dosage, potential side effects, drug interactions, consequences of not using/taking, and special instructions. Patient expressed understanding. No barriers to adherence. *Some images could not be shown.

## 2024-02-25 LAB — LAB REPORT - SCANNED: EGFR: 78

## 2024-06-18 ENCOUNTER — Emergency Department
Admission: EM | Admit: 2024-06-18 | Discharge: 2024-06-18 | Disposition: A | Discharge status: Home / Self Care | Payer: Medicare (Managed Care) | Attending: Emergency Medicine | Admitting: Emergency Medicine

## 2024-06-18 ENCOUNTER — Other Ambulatory Visit: Payer: Self-pay

## 2024-06-18 ENCOUNTER — Encounter: Payer: Self-pay | Admitting: Emergency Medicine

## 2024-06-18 ENCOUNTER — Emergency Department: Payer: Medicare (Managed Care)

## 2024-06-18 DIAGNOSIS — M069 Rheumatoid arthritis, unspecified: Secondary | ICD-10-CM | POA: Insufficient documentation

## 2024-06-18 DIAGNOSIS — E871 Hypo-osmolality and hyponatremia: Secondary | ICD-10-CM | POA: Insufficient documentation

## 2024-06-18 DIAGNOSIS — B349 Viral infection, unspecified: Secondary | ICD-10-CM | POA: Diagnosis not present

## 2024-06-18 DIAGNOSIS — J449 Chronic obstructive pulmonary disease, unspecified: Secondary | ICD-10-CM | POA: Insufficient documentation

## 2024-06-18 DIAGNOSIS — R059 Cough, unspecified: Secondary | ICD-10-CM | POA: Diagnosis present

## 2024-06-18 DIAGNOSIS — R531 Weakness: Secondary | ICD-10-CM | POA: Diagnosis present

## 2024-06-18 DIAGNOSIS — R509 Fever, unspecified: Secondary | ICD-10-CM | POA: Diagnosis present

## 2024-06-18 LAB — COMPREHENSIVE METABOLIC PANEL WITH GFR
ALT: 19 U/L (ref 0–44)
AST: 22 U/L (ref 15–41)
Albumin: 3.1 g/dL — ABNORMAL LOW (ref 3.5–5.0)
Alkaline Phosphatase: 25 U/L — ABNORMAL LOW (ref 38–126)
Anion gap: 9 (ref 5–15)
BUN: 18 mg/dL (ref 8–23)
CO2: 22 mmol/L (ref 22–32)
Calcium: 9.1 mg/dL (ref 8.9–10.3)
Chloride: 102 mmol/L (ref 98–111)
Creatinine, Ser: 0.8 mg/dL (ref 0.44–1.00)
GFR, Estimated: >60 mL/min (ref >60)
Glucose, Bld: 116 mg/dL — ABNORMAL HIGH (ref 70–99)
Potassium: 3.6 mmol/L (ref 3.5–5.1)
Sodium: 133 mmol/L — ABNORMAL LOW (ref 135–145)
Total Bilirubin: 1 mg/dL (ref 0.0–1.2)
Total Protein: 6 g/dL — ABNORMAL LOW (ref 6.5–8.1)

## 2024-06-18 LAB — CBC WITH DIFFERENTIAL/PLATELET
Abs Immature Granulocytes: 0.04 K/uL (ref 0.00–0.07)
Basophils Absolute: 0 K/uL (ref 0.0–0.1)
Basophils Relative: 0 %
Eosinophils Absolute: 0 K/uL (ref 0.0–0.5)
Eosinophils Relative: 0 %
HCT: 34.2 % — ABNORMAL LOW (ref 36.0–46.0)
Hemoglobin: 11.4 g/dL — ABNORMAL LOW (ref 12.0–15.0)
Immature Granulocytes: 0 %
Lymphocytes Relative: 8 %
Lymphs Abs: 0.7 K/uL (ref 0.7–4.0)
MCH: 31.2 pg (ref 26.0–34.0)
MCHC: 33.3 g/dL (ref 30.0–36.0)
MCV: 93.7 fL (ref 80.0–100.0)
Monocytes Absolute: 0.8 K/uL (ref 0.1–1.0)
Monocytes Relative: 9 %
Neutro Abs: 7.4 K/uL (ref 1.7–7.7)
Neutrophils Relative %: 83 %
Platelets: 207 K/uL (ref 150–400)
RBC: 3.65 MIL/uL — ABNORMAL LOW (ref 3.87–5.11)
RDW: 14.9 % (ref 11.5–15.5)
Smear Review: NORMAL
WBC: 8.9 K/uL (ref 4.0–10.5)
nRBC: 0 % (ref 0.0–0.2)

## 2024-06-18 LAB — URINALYSIS, W/ REFLEX TO CULTURE (INFECTION SUSPECTED)
Bilirubin Urine: NEGATIVE
Glucose, UA: NEGATIVE mg/dL
Ketones, ur: 20 mg/dL — AB
Leukocytes,Ua: NEGATIVE
Nitrite: NEGATIVE
Protein, ur: NEGATIVE mg/dL
Specific Gravity, Urine: 1.012 (ref 1.005–1.030)
pH: 6 (ref 5.0–8.0)

## 2024-06-18 LAB — RESP PANEL BY RT-PCR (RSV, FLU A&B, COVID)  RVPGX2
Influenza A by PCR: NEGATIVE
Influenza B by PCR: NEGATIVE
Resp Syncytial Virus by PCR: NEGATIVE
SARS Coronavirus 2 by RT PCR: NEGATIVE

## 2024-06-18 LAB — TROPONIN I (HIGH SENSITIVITY)
Troponin I (High Sensitivity): 6 ng/L (ref <18)
Troponin I (High Sensitivity): 6 ng/L (ref <18)

## 2024-06-18 LAB — LACTIC ACID, PLASMA
Lactic Acid, Venous: 1.7 mmol/L (ref 0.5–1.9)
Lactic Acid, Venous: 1.2 mmol/L (ref 0.5–1.9)

## 2024-06-18 LAB — BRAIN NATRIURETIC PEPTIDE: B Natriuretic Peptide: 16.3 pg/mL (ref 0.0–100.0)

## 2024-06-18 MED ORDER — SODIUM CHLORIDE 0.9 % IV BOLUS
500.0000 mL | Freq: Once | INTRAVENOUS | Status: AC
  Administered 2024-06-18: 500 mL via INTRAVENOUS

## 2024-06-18 MED ORDER — LOPERAMIDE HCL 2 MG PO TABS: 2.0000 mg | ORAL_TABLET | Freq: Four times a day (QID) | ORAL | 0 refills | Status: DC | PRN

## 2024-06-18 MED ORDER — ONDANSETRON 4 MG PO TBDP: 4.0000 mg | ORAL_TABLET | Freq: Three times a day (TID) | ORAL | 0 refills | Status: DC | PRN

## 2024-06-18 NOTE — Discharge Instructions (Signed)
 Your symptoms are likely caused by a viral infection.  Your lab workup and x-ray were reassuring.  You may take Zofran  as needed for nausea and loperamide  as needed for diarrhea.  This does not interact with your RA medications.  Make sure to drink plenty of fluid, and small sips if necessary, and follow-up with your regular doctor.  Return to the ER immediately for new, worsening, or persistent severe fever, weakness, vomiting, abdominal pain, difficulty breathing, or any other new or worsening symptoms that concern you.

## 2024-06-18 NOTE — ED Provider Notes (Signed)
 [facility] Provider Note    Event Date/Time   First MD Initiated Contact with Patient 06/18/24 1612     (approximate)   History   Cough   HPI  Lori Banks is a 63 y.o. female with history of rheumatoid arthritis, COPD, and prediabetes who presents with fevers for the last 5 days as high as 101-102 at home associated with nausea, diarrhea, decreased appetite, and nonproductive cough.  The patient reports body aches and generalized weakness.  She denies any vomiting.  She has no chest pain or any significant difficulty breathing.  She has no sick contacts.  I reviewed the past medical records.  The patient has no recent ED visits or hospitalizations.  Her most recent outpatient encounter was with rheumatology on 2/4 for follow-up of her chronic RA.   Physical Exam   Triage Vital Signs: ED Triage Vitals  Encounter Vitals Group     BP 06/18/24 1613 127/67     Girls Systolic BP Percentile --      Girls Diastolic BP Percentile --      Boys Systolic BP Percentile --      Boys Diastolic BP Percentile --      Pulse Rate 06/18/24 1613 (!) 107     Resp 06/18/24 1613 (!) 25     Temp 06/18/24 1613 100.1 F (37.8 C)     Temp Source 06/18/24 1613 Oral     SpO2 --      Weight 06/18/24 1611 208 lb (94.3 kg)     Height 06/18/24 1611 5' 6 (1.676 m)     Head Circumference --      Peak Flow --      Pain Score 06/18/24 1611 0     Pain Loc --      Pain Education --      Exclude from Growth Chart --     Most recent vital signs: Vitals:   06/18/24 2000 06/18/24 2014  BP: 97/66   Pulse: 63   Resp: 18   Temp:  98.3 F (36.8 C)  SpO2: 95%      General:  Alert, relatively well-appearing, no distress.  CV:  Good peripheral perfusion.  Resp:  Normal effort.  Lungs CTAB. Abd:  Soft with no focal tenderness.  No distention.  Other:  No jaundice or scleral icterus.  Somewhat dry mucous membranes.   ED Results / Procedures / Treatments    Labs (all labs ordered are listed, but only abnormal results are displayed) Labs Reviewed  COMPREHENSIVE METABOLIC PANEL WITH GFR - Abnormal; Notable for the following components:      Result Value   Sodium 133 (*)    Glucose, Bld 116 (*)    Total Protein 6.0 (*)    Albumin 3.1 (*)    Alkaline Phosphatase 25 (*)    All other components within normal limits  CBC WITH DIFFERENTIAL/PLATELET - Abnormal; Notable for the following components:   RBC 3.65 (*)    Hemoglobin 11.4 (*)    HCT 34.2 (*)    All other components within normal limits  URINALYSIS, W/ REFLEX TO CULTURE (INFECTION SUSPECTED) - Abnormal; Notable for the following components:   Color, Urine YELLOW (*)    APPearance CLEAR (*)    Hgb urine dipstick SMALL (*)    Ketones, ur 20 (*)    Bacteria, UA RARE (*)    All other components within normal limits  RESP PANEL BY RT-PCR (RSV, FLU A&B, COVID)  [id number]  CULTURE, BLOOD (ROUTINE X 2)  CULTURE, BLOOD (ROUTINE X 2)  LACTIC ACID, PLASMA  LACTIC ACID, PLASMA  BRAIN NATRIURETIC PEPTIDE  TROPONIN I (HIGH SENSITIVITY)  TROPONIN I (HIGH SENSITIVITY)     EKG  ED ECG REPORT I, Waylon Cassis, the attending physician, personally viewed and interpreted this ECG.  Date: 06/18/2024 EKG Time: 1614 Rate: 106 Rhythm: sinus tachycardia QRS Axis: normal Intervals: normal ST/T Wave abnormalities: normal Narrative Interpretation: no evidence of acute ischemia    RADIOLOGY  Chest x-ray: I independently viewed and interpreted the images; there is no focal consolidation or edema  PROCEDURES:  Critical Care performed: No  Procedures   MEDICATIONS ORDERED IN ED: Medications  sodium chloride  0.9 % bolus 500 mL (0 mLs Intravenous Stopped 06/18/24 1834)     IMPRESSION / MDM / ASSESSMENT AND PLAN / ED COURSE  I reviewed the triage vital signs and the nursing notes.  63 year old female with PMH as noted above presents with fever, diarrhea, generalized weakness, and  cough for the last 5 days.  On exam she has a low-grade fever and borderline tachycardia.  Other vital signs are normal.  She is overall well-appearing.  Lungs are clear to auscultation.  Abdomen soft nontender.  Differential diagnosis includes, but is not limited to, viral gastroenteritis, COVID, flu, or other viral syndrome, pneumonia, UTI.  I do not suspect colitis or diverticulitis.  The patient has no focal abdominal tenderness.  We will obtain lab workup, chest x-ray, give fluids, and reassess.  Patient's presentation is most consistent with acute complicated illness / injury requiring diagnostic workup.  The patient is on the cardiac monitor to evaluate for evidence of arrhythmia and/or significant heart rate changes.   ----------------------------------------- 9:36 PM on 06/18/2024 -----------------------------------------  Lab workup is reassuring.  CMP shows no acute findings.  CBC shows no leukocytosis.  Troponin, BNP, lactate are all within normal limits.  Urinalysis does not show any findings to suggest UTI.  Respiratory panel is negative.  Chest x-ray is clear.  There were a few low blood pressure readings but these were all when the patient was lying on her left side, the side with the blood pressure cuff.  When sitting up her blood pressure is normal, most recently 117/87 when I went to reassess her.  Overall presentation is consistent with viral syndrome.  I did consider whether the patient may benefit from inpatient admission, however given the overall reassuring workup and her stable vital signs, there is no specific indication for inpatient management.  The patient is tolerating p.o.  I had an extensive discussion with her about the results of the workup and plan of care.  I discussed the possibility of admission with her, however she agrees that given that we would not be doing any treatment other than fluids, which we have already administered, she feels comfortable going  home.  I gave the patient strict return precautions and she expressed understanding.  I have prescribed loperamide  and Zofran .   FINAL CLINICAL IMPRESSION(S) / ED DIAGNOSES   Final diagnoses:  Viral syndrome     Rx / DC Orders   ED Discharge Orders          Ordered    ondansetron  (ZOFRAN -ODT) 4 MG disintegrating tablet  Every 8 hours PRN        06/18/24 2135    loperamide  (IMODIUM  A-D) 2 MG tablet  4 times daily PRN        06/18/24 2135  Note:  This document was prepared using Dragon voice recognition software and may include unintentional dictation errors.    Jacolyn Pae, MD 06/18/24 2153

## 2024-06-18 NOTE — ED Triage Notes (Signed)
 Pt via [facility] from home. Pt c/o dry cough since last Tuyesday reports that she gets SOB after a coughing spell. Denies pain. Pt has a hx of COPD. EMS reports BP 100/68, 101.8 temp, HR 130s, and RR 26. EMS gave 1g of Tylenol  IV and bolus. Pt is A&OX4 and NAD

## 2024-06-18 NOTE — ED Notes (Signed)
Pt tolerating crackers and water at this time.

## 2024-06-23 LAB — CULTURE, BLOOD (ROUTINE X 2)
Culture: NO GROWTH
Culture: NO GROWTH
Special Requests: ADEQUATE

## 2024-07-13 ENCOUNTER — Encounter: Payer: Self-pay | Admitting: Cardiology

## 2024-07-13 ENCOUNTER — Ambulatory Visit (INDEPENDENT_AMBULATORY_CARE_PROVIDER_SITE_OTHER): Payer: Medicare (Managed Care) | Admitting: Cardiology

## 2024-07-13 VITALS — BP 110/75 | HR 69 | Ht 66.0 in | Wt 188.0 lb

## 2024-07-13 DIAGNOSIS — Z0130 Encounter for examination of blood pressure without abnormal findings: Secondary | ICD-10-CM

## 2024-07-13 DIAGNOSIS — B001 Herpesviral vesicular dermatitis: Secondary | ICD-10-CM

## 2024-07-13 MED ORDER — VALACYCLOVIR HCL 1 G PO TABS: 2000.0000 mg | ORAL_TABLET | Freq: Once | ORAL | 0 refills | Status: AC

## 2024-07-13 MED ORDER — VALACYCLOVIR HCL 500 MG PO TABS: 500.0000 mg | ORAL_TABLET | Freq: Every day | ORAL | 0 refills | Status: AC

## 2024-07-13 NOTE — Progress Notes (Addendum)
 Established Patient Office Visit  Subjective:  Patient ID: Lori Banks, female    DOB: October 28, 1961  Age: 63 y.o. MRN: [id number]  Chief Complaint  Patient presents with   Acute Visit    Pt felt sick on the 25th and cold sores appeared 2 days later.     Patient in office for an acute visit, complaining of cold sores on her lips. Patient recently went to ED with a viral infection. After feeling better, patient reports developing two cold sores around her mouth. Patient states she has had cold sores previously but never this bad. Will send in Valtrex  2000 mg to take today, then 500 mg daily.     No other concerns at this time.   Past Medical History:  Diagnosis Date   COPD (chronic obstructive pulmonary disease) (HCC)    History of kidney stones    Kidney stone    Osteoporosis    Pre-diabetes    Rheumatoid arthritis (HCC)     Past Surgical History:  Procedure Laterality Date   APPENDECTOMY     CESAREAN SECTION     CHOLECYSTECTOMY     CYSTOSCOPY WITH STENT PLACEMENT Left 06/17/2022   Procedure: CYSTOSCOPY WITH STENT PLACEMENT;  Surgeon: Francisca Redell BROCKS, MD;  Location: [facility];  Service: Urology;  Laterality: Left;   CYSTOSCOPY/URETEROSCOPY/HOLMIUM LASER/STENT PLACEMENT Left 07/03/2022   Procedure: CYSTOSCOPY/URETEROSCOPY/HOLMIUM LASER/STENT EXCHANGE;  Surgeon: Francisca Redell BROCKS, MD;  Location: [facility];  Service: Urology;  Laterality: Left;   MULTIPLE TOOTH EXTRACTIONS     all teeth have been removed, patient wears dentures    ORIF ANKLE FRACTURE Right 03/09/2022   Procedure: OPEN REDUCTION INTERNAL FIXATION (ORIF) ANKLE FRACTURE;  Surgeon: Leora Lynwood SAUNDERS, MD;  Location: [facility];  Service: Orthopedics;  Laterality: Right;   TUBAL LIGATION      Social History   Socioeconomic History   Marital status: Legally Separated    Spouse name: Not on file   Number of children: Not on file   Years of education: Not on file   Highest education level: Not on file   Occupational History   Not on file  Tobacco Use   Smoking status: Former    Current packs/day: 0.00    Types: Cigarettes    Quit date: 04/2019    Years since quitting: 5.2    Passive exposure: Past   Smokeless tobacco: Never  Vaping Use   Vaping status: Never Used  Substance and Sexual Activity   Alcohol use: Yes    Comment: occassionally   Drug use: Yes    Types: Marijuana    Comment: 5 days weekly    Sexual activity: Not Currently  Other Topics Concern   Not on file  Social History Narrative   Not on file   Social Drivers of Health   Financial Resource Strain: Not on file  Food Insecurity: Not on file  Transportation Needs: Not on file  Physical Activity: Not on file  Stress: Not on file  Social Connections: Not on file  Intimate Partner Violence: Not At Risk (07/03/2024)   [facility]   Humiliation, Afraid, Rape, and Kick questionnaire    Within the last year, have you been afraid of your partner or ex-partner?: No    Within the last year, have you been humiliated or emotionally abused in other ways by your partner or ex-partner?: No    Within the last year, have you been kicked, hit, slapped, or otherwise physically hurt  by your partner or ex-partner?: No    Within the last year, have you been raped or forced to have any kind of sexual activity by your partner or ex-partner?: No    Family History  Problem Relation Age of Onset   Diabetes Mother    Osteoarthritis Mother    Heart disease Father    Diabetes Sister    Fibromyalgia Sister    Meniere's disease Brother    Healthy Son    Healthy Son    Seizures Son    Healthy Daughter    Epilepsy Nephew     No Known Allergies  Outpatient Medications Prior to Visit  Medication Sig   alendronate (FOSAMAX) 70 MG tablet Take 70 mg by mouth once a week.   diclofenac  Sodium (VOLTAREN ) 1 % GEL Apply topically 2 (two) times daily as needed (pain).   loperamide  (IMODIUM  A-D) 2 MG tablet Take 1  tablet (2 mg total) by mouth 4 (four) times daily as needed for diarrhea or loose stools.   methotrexate  (RHEUMATREX) 2.5 MG tablet Take 7.5mg  in the morning and with dinner one day per week.   ondansetron  (ZOFRAN -ODT) 4 MG disintegrating tablet Take 1 tablet (4 mg total) by mouth every 8 (eight) hours as needed.   atorvastatin  (LIPITOR) 10 MG tablet Take 10 mg by mouth at bedtime. (Patient not taking: Reported on 07/13/2024)   [DISCONTINUED] Cholecalciferol (VITAMIN D3) 1.25 MG (50000 UT) CAPS Take 1 capsule by mouth once a week.   [DISCONTINUED] sulfaSALAzine (AZULFIDINE) 500 MG EC tablet Take 1,000 mg by mouth 2 (two) times daily.   [DISCONTINUED] XELJANZ  5 MG TABS Take 1 tablet by mouth 2 (two) times daily.   No facility-administered medications prior to visit.    Review of Systems  Constitutional: Negative.   HENT: Negative.    Eyes: Negative.   Respiratory: Negative.  Negative for shortness of breath.   Cardiovascular: Negative.  Negative for chest pain.  Gastrointestinal: Negative.  Negative for abdominal pain, constipation and diarrhea.  Genitourinary: Negative.   Musculoskeletal:  Negative for joint pain and myalgias.  Skin: Negative.        Cold sore  Neurological: Negative.  Negative for dizziness and headaches.  Endo/Heme/Allergies: Negative.   All other systems reviewed and are negative.      Objective:   BP 110/75   Pulse 69   Ht 5' 6 (1.676 m)   Wt 188 lb (85.3 kg)   SpO2 97%   BMI 30.34 kg/m   Vitals:   07/13/24 1254  BP: 110/75  Pulse: 69  Height: 5' 6 (1.676 m)  Weight: 188 lb (85.3 kg)  SpO2: 97%  BMI (Calculated): 30.36    Physical Exam Vitals and nursing note reviewed.  Constitutional:      Appearance: Normal appearance. She is normal weight.  HENT:     Head: Normocephalic and atraumatic.      Nose: Nose normal.     Mouth/Throat:     Mouth: Mucous membranes are moist.  Eyes:     Extraocular Movements: Extraocular movements intact.      Conjunctiva/sclera: Conjunctivae normal.     Pupils: Pupils are equal, round, and reactive to light.  Cardiovascular:     Rate and Rhythm: Normal rate and regular rhythm.     Pulses: Normal pulses.     Heart sounds: Normal heart sounds.  Pulmonary:     Effort: Pulmonary effort is normal.     Breath sounds: Normal breath sounds.  Abdominal:  General: Abdomen is flat. Bowel sounds are normal.     Palpations: Abdomen is soft.  Musculoskeletal:        General: Normal range of motion.     Cervical back: Normal range of motion.  Skin:    General: Skin is warm and dry.  Neurological:     General: No focal deficit present.     Mental Status: She is alert and oriented to person, place, and time.  Psychiatric:        Mood and Affect: Mood normal.        Behavior: Behavior normal.        Thought Content: Thought content normal.        Judgment: Judgment normal.      No results found for any visits on 07/13/24.  Recent Results (from the past 2160 hours)  Lactic acid, plasma     Status: None   Collection Time: 06/18/24  4:26 PM  Result Value Ref Range   Lactic Acid, Venous 1.7 0.5 - 1.9 mmol/L    Comment: Performed at [facility], [address] [place], [place] [address]  Comprehensive metabolic panel     Status: Abnormal   Collection Time: 06/18/24  4:26 PM  Result Value Ref Range   Sodium 133 (L) 135 - 145 mmol/L   Potassium 3.6 3.5 - 5.1 mmol/L   Chloride 102 98 - 111 mmol/L   CO2 22 22 - 32 mmol/L   Glucose, Bld 116 (H) 70 - 99 mg/dL    Comment: Glucose reference range applies only to samples taken after fasting for at least 8 hours.   BUN 18 8 - 23 mg/dL   Creatinine, Ser 9.19 0.44 - 1.00 mg/dL   Calcium  9.1 8.9 - 10.3 mg/dL   Total Protein 6.0 (L) 6.5 - 8.1 g/dL   Albumin 3.1 (L) 3.5 - 5.0 g/dL   AST 22 15 - 41 U/L   ALT 19 0 - 44 U/L   Alkaline Phosphatase 25 (L) 38 - 126 U/L   Total Bilirubin 1.0 0.0 - 1.2 mg/dL   GFR, Estimated >39 >39 mL/min     Comment: (NOTE) Calculated using the CKD-EPI Creatinine Equation (2021)    Anion gap 9 5 - 15    Comment: Performed at [facility], [address] [place], [place] [address]  CBC with Differential     Status: Abnormal   Collection Time: 06/18/24  4:26 PM  Result Value Ref Range   WBC 8.9 4.0 - 10.5 K/uL   RBC 3.65 (L) 3.87 - 5.11 MIL/uL   Hemoglobin 11.4 (L) 12.0 - 15.0 g/dL   HCT 65.7 (L) 63.9 - 53.9 %   MCV 93.7 80.0 - 100.0 fL   MCH 31.2 26.0 - 34.0 pg   MCHC 33.3 30.0 - 36.0 g/dL   RDW 85.0 88.4 - 84.4 %   Platelets 207 150 - 400 K/uL   nRBC 0.0 0.0 - 0.2 %   Neutrophils Relative % 83 %   Neutro Abs 7.4 1.7 - 7.7 K/uL   Lymphocytes Relative 8 %   Lymphs Abs 0.7 0.7 - 4.0 K/uL   Monocytes Relative 9 %   Monocytes Absolute 0.8 0.1 - 1.0 K/uL   Eosinophils Relative 0 %   Eosinophils Absolute 0.0 0.0 - 0.5 K/uL   Basophils Relative 0 %   Basophils Absolute 0.0 0.0 - 0.1 K/uL   WBC Morphology MORPHOLOGY UNREMARKABLE    RBC Morphology MORPHOLOGY UNREMARKABLE  Smear Review Normal platelet morphology    Immature Granulocytes 0 %   Abs Immature Granulocytes 0.04 0.00 - 0.07 K/uL    Comment: Performed at [facility], [address] [place], [place] [address]  Brain natriuretic peptide     Status: None   Collection Time: 06/18/24  4:26 PM  Result Value Ref Range   B Natriuretic Peptide 16.3 0.0 - 100.0 pg/mL    Comment: Performed at [facility], [address] [place], [place] [address]  Troponin I (High Sensitivity)     Status: None   Collection Time: 06/18/24  4:26 PM  Result Value Ref Range   Troponin I (High Sensitivity) 6 <18 ng/L    Comment: (NOTE) Elevated high sensitivity troponin I (hsTnI) values and significant  changes across serial measurements may suggest ACS but many other  chronic and acute conditions are known to elevate hsTnI results.  Refer to the Links section for chest pain algorithms and additional   guidance. Performed at [facility], [address] [place], [place] [address]   Lactic acid, plasma     Status: None   Collection Time: 06/18/24  5:50 PM  Result Value Ref Range   Lactic Acid, Venous 1.2 0.5 - 1.9 mmol/L    Comment: Performed at [facility], [address] [place], [place] [address]  Blood Culture (routine x 2)     Status: None   Collection Time: 06/18/24  5:50 PM   Specimen: BLOOD  Result Value Ref Range   Specimen Description BLOOD BLOOD LEFT FOREARM    Special Requests      BOTTLES DRAWN AEROBIC AND ANAEROBIC Blood Culture adequate volume   Culture      NO GROWTH 5 DAYS Performed at [facility], [address] [place], [place] [address]    Report Status 06/23/2024 FINAL   Troponin I (High Sensitivity)     Status: None   Collection Time: 06/18/24  5:50 PM  Result Value Ref Range   Troponin I (High Sensitivity) 6 <18 ng/L    Comment: (NOTE) Elevated high sensitivity troponin I (hsTnI) values and significant  changes across serial measurements may suggest ACS but many other  chronic and acute conditions are known to elevate hsTnI results.  Refer to the Links section for chest pain algorithms and additional  guidance. Performed at [facility], [address] [place], [place] [address]   Blood Culture (routine x 2)     Status: None   Collection Time: 06/18/24  5:55 PM   Specimen: BLOOD RIGHT HAND  Result Value Ref Range   Specimen Description BLOOD RIGHT HAND    Special Requests      BOTTLES DRAWN AEROBIC AND ANAEROBIC Blood Culture results may not be optimal due to an inadequate volume of blood received in culture bottles   Culture      NO GROWTH 5 DAYS Performed at [facility], [address] [place], [place] [address]    Report Status 06/23/2024 FINAL   Resp panel by RT-PCR (RSV, Flu A&B, Covid) Anterior Nasal Swab     Status: None   Collection Time: 06/18/24  6:15 PM   Specimen: Anterior  Nasal Swab  Result Value Ref Range   SARS Coronavirus 2 by RT PCR NEGATIVE NEGATIVE    Comment: (NOTE) SARS-CoV-2 target nucleic acids are NOT DETECTED.  The SARS-CoV-2 RNA is generally detectable in upper respiratory specimens during the acute phase of infection. The lowest concentration  of SARS-CoV-2 viral copies this assay can detect is 138 copies/mL. A negative result does not preclude SARS-Cov-2 infection and should not be used as the sole basis for treatment or other patient management decisions. A negative result may occur with  improper specimen collection/handling, submission of specimen other than nasopharyngeal swab, presence of viral mutation(s) within the areas targeted by this assay, and inadequate number of viral copies(<138 copies/mL). A negative result must be combined with clinical observations, patient history, and epidemiological information. The expected result is Negative.  Fact Sheet for Patients:  BloggerCourse.com  Fact Sheet for Healthcare Providers:  SeriousBroker.it  This test is no t yet approved or cleared by the [place]  FDA and  has been authorized for detection and/or diagnosis of SARS-CoV-2 by FDA under an Emergency Use Authorization (EUA). This EUA will remain  in effect (meaning this test can be used) for the duration of the COVID-19 declaration under Section 564(b)(1) of the Act, 21 U.S.C.section 360bbb-3(b)(1), unless the authorization is terminated  or revoked sooner.       Influenza A by PCR NEGATIVE NEGATIVE   Influenza B by PCR NEGATIVE NEGATIVE    Comment: (NOTE) The Xpert Xpress SARS-CoV-2/FLU/RSV plus assay is intended as an aid in the diagnosis of influenza from Nasopharyngeal swab specimens and should not be used as a sole basis for treatment. Nasal washings and aspirates are unacceptable for Xpert Xpress SARS-CoV-2/FLU/RSV testing.  Fact Sheet for  Patients: BloggerCourse.com  Fact Sheet for Healthcare Providers: SeriousBroker.it  This test is not yet approved or cleared by the [place]  FDA and has been authorized for detection and/or diagnosis of SARS-CoV-2 by FDA under an Emergency Use Authorization (EUA). This EUA will remain in effect (meaning this test can be used) for the duration of the COVID-19 declaration under Section 564(b)(1) of the Act, 21 U.S.C. section 360bbb-3(b)(1), unless the authorization is terminated or revoked.     Resp Syncytial Virus by PCR NEGATIVE NEGATIVE    Comment: (NOTE) Fact Sheet for Patients: BloggerCourse.com  Fact Sheet for Healthcare Providers: SeriousBroker.it  This test is not yet approved or cleared by the [place]  FDA and has been authorized for detection and/or diagnosis of SARS-CoV-2 by FDA under an Emergency Use Authorization (EUA). This EUA will remain in effect (meaning this test can be used) for the duration of the COVID-19 declaration under Section 564(b)(1) of the Act, 21 U.S.C. section 360bbb-3(b)(1), unless the authorization is terminated or revoked.  Performed at [facility], [address] [place], [place] [address]   Urinalysis, w/ Reflex to Culture (Infection Suspected) -Urine, Clean Catch     Status: Abnormal   Collection Time: 06/18/24  6:15 PM  Result Value Ref Range   Specimen Source URINE, CLEAN CATCH    Color, Urine YELLOW (A) YELLOW   APPearance CLEAR (A) CLEAR   Specific Gravity, Urine 1.012 1.005 - 1.030   pH 6.0 5.0 - 8.0   Glucose, UA NEGATIVE NEGATIVE mg/dL   Hgb urine dipstick SMALL (A) NEGATIVE   Bilirubin Urine NEGATIVE NEGATIVE   Ketones, ur 20 (A) NEGATIVE mg/dL   Protein, ur NEGATIVE NEGATIVE mg/dL   Nitrite NEGATIVE NEGATIVE   Leukocytes,Ua NEGATIVE NEGATIVE   RBC / HPF 0-5 0 - 5 RBC/hpf   WBC, UA 0-5 0 - 5 WBC/hpf     Comment:        Reflex urine culture not performed if WBC <=10, OR if Squamous epithelial cells >5. If Squamous epithelial cells >5 suggest recollection.  Bacteria, UA RARE (A) NONE SEEN   Squamous Epithelial / HPF 0-5 0 - 5 /HPF   Mucus PRESENT    Hyaline Casts, UA PRESENT     Comment: Performed at [facility], [address], [place], [place] [address]      Assessment & Plan:  Valtrex  for cold sores.  Problem List Items Addressed This Visit       Digestive   Cold sore - Primary   Relevant Medications   valACYclovir  (VALTREX ) 1000 MG tablet   valACYclovir  (VALTREX ) 500 MG tablet (Start on 07/14/2024)    Return if symptoms worsen or fail to improve, for as scheduled with NK.   Total time spent: 25 minutes  Google, NP  07/13/2024   This document may have been prepared by Dragon Voice Recognition software and as such may include unintentional dictation errors.

## 2024-07-17 ENCOUNTER — Ambulatory Visit: Payer: Medicare (Managed Care) | Admitting: Internal Medicine

## 2024-08-04 ENCOUNTER — Encounter: Payer: Self-pay | Admitting: Internal Medicine

## 2024-08-04 ENCOUNTER — Ambulatory Visit (INDEPENDENT_AMBULATORY_CARE_PROVIDER_SITE_OTHER): Payer: Medicare (Managed Care) | Admitting: Internal Medicine

## 2024-08-04 VITALS — BP 108/82 | HR 54 | Ht 66.0 in | Wt 192.4 lb

## 2024-08-04 DIAGNOSIS — R5383 Other fatigue: Secondary | ICD-10-CM

## 2024-08-04 DIAGNOSIS — D649 Anemia, unspecified: Secondary | ICD-10-CM | POA: Insufficient documentation

## 2024-08-04 DIAGNOSIS — K219 Gastro-esophageal reflux disease without esophagitis: Secondary | ICD-10-CM

## 2024-08-04 DIAGNOSIS — E782 Mixed hyperlipidemia: Secondary | ICD-10-CM | POA: Diagnosis not present

## 2024-08-04 DIAGNOSIS — D508 Other iron deficiency anemias: Secondary | ICD-10-CM | POA: Diagnosis not present

## 2024-08-04 DIAGNOSIS — Z1211 Encounter for screening for malignant neoplasm of colon: Secondary | ICD-10-CM

## 2024-08-04 DIAGNOSIS — R7303 Prediabetes: Secondary | ICD-10-CM | POA: Diagnosis not present

## 2024-08-04 DIAGNOSIS — Z0130 Encounter for examination of blood pressure without abnormal findings: Secondary | ICD-10-CM

## 2024-08-04 DIAGNOSIS — M0579 Rheumatoid arthritis with rheumatoid factor of multiple sites without organ or systems involvement: Secondary | ICD-10-CM

## 2024-08-04 NOTE — Patient Instructions (Signed)
 a

## 2024-08-04 NOTE — Progress Notes (Signed)
 Established Patient Office Visit  Subjective:  Patient ID: Lori Banks, female    DOB: 01/26/1961  Age: 63 y.o. MRN: [id number]  Chief Complaint  Patient presents with   Establish Care    NPE    Lori Banks is here to establish care but was seen recently for an acute visit for cold sores. She is overall doing okay today. She reports taking her medications as prescribed. She is up to date on her mammogram and Dexa scan. 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 Patient reports chronic anemia, as noticed on her CBC in July. Patient has never had colon cancer screening performed. Will order Cologuard and Hemmocult cards today at patient's request. She reports she used to smoke 1ppd but stopped smoking approximately 5.5 years ago. Will discuss low dose chest CT at follow up appointment. Patient sees Rheumatology for her RA management. Patient has complaints today of shortness of breath when walking up steep stairs but overall denies symptoms currently. She also denies headache, chest pain, nausea, vomiting, diarrhea/constipation. Routine labs ordered today as well as additional studies for anemia workup.    No other concerns at this time.   Past Medical History:  Diagnosis Date   COPD (chronic obstructive pulmonary disease) (HCC)    History of kidney stones    Kidney stone    Osteoporosis    Pre-diabetes    Rheumatoid arthritis (HCC)     Past Surgical History:  Procedure Laterality Date   APPENDECTOMY     CESAREAN SECTION     CHOLECYSTECTOMY     CYSTOSCOPY WITH STENT PLACEMENT Left 06/17/2022   Procedure: CYSTOSCOPY WITH STENT PLACEMENT;  Surgeon: Francisca Redell BROCKS, MD;  Location: [facility];  Service: Urology;  Laterality: Left;   CYSTOSCOPY/URETEROSCOPY/HOLMIUM LASER/STENT PLACEMENT Left 07/03/2022   Procedure: CYSTOSCOPY/URETEROSCOPY/HOLMIUM LASER/STENT EXCHANGE;  Surgeon: Francisca Redell BROCKS, MD;  Location: [facility];  Service: Urology;  Laterality: Left;   MULTIPLE  TOOTH EXTRACTIONS     all teeth have been removed, patient wears dentures    ORIF ANKLE FRACTURE Right 03/09/2022   Procedure: OPEN REDUCTION INTERNAL FIXATION (ORIF) ANKLE FRACTURE;  Surgeon: Leora Lynwood SAUNDERS, MD;  Location: [facility];  Service: Orthopedics;  Laterality: Right;   TUBAL LIGATION      Social History   Socioeconomic History   Marital status: Legally Separated    Spouse name: Not on file   Number of children: Not on file   Years of education: Not on file   Highest education level: Not on file  Occupational History   Not on file  Tobacco Use   Smoking status: Former    Current packs/day: 0.00    Types: Cigarettes    Quit date: 04/2019    Years since quitting: 5.3    Passive exposure: Past   Smokeless tobacco: Never  Vaping Use   Vaping status: Never Used  Substance and Sexual Activity   Alcohol use: Yes    Comment: occassionally   Drug use: Yes    Types: Marijuana    Comment: 5 days weekly    Sexual activity: Not Currently  Other Topics Concern   Not on file  Social History Narrative   Not on file   Social Drivers of Health   Financial Resource Strain: Not on file  Food Insecurity: Not on file  Transportation Needs: Not on file  Physical Activity: Not on file  Stress: Not on file  Social Connections: Not on file  Intimate Partner Violence: Not At  Risk (07/03/2024)   Received from [facility]   Humiliation, Afraid, Rape, and Kick questionnaire    Within the last year, have you been afraid of your partner or ex-partner?: No    Within the last year, have you been humiliated or emotionally abused in other ways by your partner or ex-partner?: No    Within the last year, have you been kicked, hit, slapped, or otherwise physically hurt by your partner or ex-partner?: No    Within the last year, have you been raped or forced to have any kind of sexual activity by your partner or ex-partner?: No    Family History  Problem Relation Age of Onset   Diabetes  Mother    Osteoarthritis Mother    Heart disease Father    Diabetes Sister    Fibromyalgia Sister    Meniere's disease Brother    Healthy Son    Healthy Son    Seizures Son    Healthy Daughter    Epilepsy Nephew     No Known Allergies  Outpatient Medications Prior to Visit  Medication Sig   alendronate (FOSAMAX) 70 MG tablet Take 70 mg by mouth once a week.   atorvastatin  (LIPITOR) 10 MG tablet Take 10 mg by mouth at bedtime.   diclofenac  Sodium (VOLTAREN ) 1 % GEL Apply topically 2 (two) times daily as needed (pain).   folic acid  (FOLVITE ) 1 MG tablet Take 2 mg by mouth daily.   methotrexate  (RHEUMATREX) 2.5 MG tablet Take 7.5mg  in the morning and with dinner one day per week.   Upadacitinib ER (RINVOQ) 15 MG TB24 Take 1 tablet by mouth daily.   valACYclovir  (VALTREX ) 500 MG tablet Take 1 tablet (500 mg total) by mouth daily.   [DISCONTINUED] loperamide  (IMODIUM  A-D) 2 MG tablet Take 1 tablet (2 mg total) by mouth 4 (four) times daily as needed for diarrhea or loose stools. (Patient not taking: Reported on 08/04/2024)   [DISCONTINUED] ondansetron  (ZOFRAN -ODT) 4 MG disintegrating tablet Take 1 tablet (4 mg total) by mouth every 8 (eight) hours as needed. (Patient not taking: Reported on 08/04/2024)   No facility-administered medications prior to visit.    Review of Systems  Constitutional:  Positive for malaise/fatigue.  HENT: Negative.    Eyes: Negative.   Respiratory: Negative.  Negative for cough and shortness of breath.   Cardiovascular: Negative.  Negative for chest pain, palpitations and leg swelling.  Gastrointestinal: Negative.  Negative for abdominal pain, blood in stool, constipation, diarrhea, heartburn, melena, nausea and vomiting.  Genitourinary: Negative.  Negative for dysuria and flank pain.  Musculoskeletal:  Positive for joint pain (RA). Negative for myalgias.  Skin: Negative.   Neurological: Negative.  Negative for dizziness and headaches.  Endo/Heme/Allergies:  Negative.   Psychiatric/Behavioral: Negative.  Negative for depression and suicidal ideas. The patient is not nervous/anxious.        Objective:   BP 108/82   Pulse (!) 54   Ht 5' 6 (1.676 m)   Wt 192 lb 6.4 oz (87.3 kg)   SpO2 96%   BMI 31.05 kg/m   Vitals:   08/04/24 1254  BP: 108/82  Pulse: (!) 54  Height: 5' 6 (1.676 m)  Weight: 192 lb 6.4 oz (87.3 kg)  SpO2: 96%  BMI (Calculated): 31.07    Physical Exam Vitals and nursing note reviewed.  Constitutional:      Appearance: Normal appearance.  HENT:     Head: Normocephalic and atraumatic.     Nose: Nose  normal.     Mouth/Throat:     Mouth: Mucous membranes are moist.     Pharynx: Oropharynx is clear.  Eyes:     Conjunctiva/sclera: Conjunctivae normal.     Pupils: Pupils are equal, round, and reactive to light.  Cardiovascular:     Rate and Rhythm: Normal rate and regular rhythm.     Pulses: Normal pulses.     Heart sounds: Normal heart sounds. No murmur heard. Pulmonary:     Effort: Pulmonary effort is normal.     Breath sounds: Normal breath sounds. No wheezing.  Abdominal:     General: Bowel sounds are normal.     Palpations: Abdomen is soft.     Tenderness: There is no abdominal tenderness. There is no right CVA tenderness or left CVA tenderness.  Musculoskeletal:        General: Swelling present. Normal range of motion.     Right hand: Swelling present.     Left hand: Swelling present.     Cervical back: Normal range of motion.     Right lower leg: No edema.     Left lower leg: No edema.     Comments: Bilateral MCP and PIP joints of hands  Skin:    General: Skin is warm and dry.  Neurological:     General: No focal deficit present.     Mental Status: She is alert and oriented to person, place, and time.  Psychiatric:        Mood and Affect: Mood normal.        Behavior: Behavior normal.      No results found for any visits on 08/04/24.  Recent Results (from the past 2160 hours)  Lactic  acid, plasma     Status: None   Collection Time: 06/18/24  4:26 PM  Result Value Ref Range   Lactic Acid, Venous 1.7 0.5 - 1.9 mmol/L    Comment: Performed at [facility], [address] [place], [place] [address]  Comprehensive metabolic panel     Status: Abnormal   Collection Time: 06/18/24  4:26 PM  Result Value Ref Range   Sodium 133 (L) 135 - 145 mmol/L   Potassium 3.6 3.5 - 5.1 mmol/L   Chloride 102 98 - 111 mmol/L   CO2 22 22 - 32 mmol/L   Glucose, Bld 116 (H) 70 - 99 mg/dL    Comment: Glucose reference range applies only to samples taken after fasting for at least 8 hours.   BUN 18 8 - 23 mg/dL   Creatinine, Ser 9.19 0.44 - 1.00 mg/dL   Calcium  9.1 8.9 - 10.3 mg/dL   Total Protein 6.0 (L) 6.5 - 8.1 g/dL   Albumin 3.1 (L) 3.5 - 5.0 g/dL   AST 22 15 - 41 U/L   ALT 19 0 - 44 U/L   Alkaline Phosphatase 25 (L) 38 - 126 U/L   Total Bilirubin 1.0 0.0 - 1.2 mg/dL   GFR, Estimated >39 >39 mL/min    Comment: (NOTE) Calculated using the CKD-EPI Creatinine Equation (2021)    Anion gap 9 5 - 15    Comment: Performed at [facility], [address] [place], [place] [address]  CBC with Differential     Status: Abnormal   Collection Time: 06/18/24  4:26 PM  Result Value Ref Range   WBC 8.9 4.0 - 10.5 K/uL   RBC 3.65 (L) 3.87 - 5.11 MIL/uL   Hemoglobin 11.4 (L) 12.0 - 15.0 g/dL  HCT 34.2 (L) 36.0 - 46.0 %   MCV 93.7 80.0 - 100.0 fL   MCH 31.2 26.0 - 34.0 pg   MCHC 33.3 30.0 - 36.0 g/dL   RDW 85.0 88.4 - 84.4 %   Platelets 207 150 - 400 K/uL   nRBC 0.0 0.0 - 0.2 %   Neutrophils Relative % 83 %   Neutro Abs 7.4 1.7 - 7.7 K/uL   Lymphocytes Relative 8 %   Lymphs Abs 0.7 0.7 - 4.0 K/uL   Monocytes Relative 9 %   Monocytes Absolute 0.8 0.1 - 1.0 K/uL   Eosinophils Relative 0 %   Eosinophils Absolute 0.0 0.0 - 0.5 K/uL   Basophils Relative 0 %   Basophils Absolute 0.0 0.0 - 0.1 K/uL   WBC Morphology MORPHOLOGY UNREMARKABLE    RBC Morphology MORPHOLOGY  UNREMARKABLE    Smear Review Normal platelet morphology    Immature Granulocytes 0 %   Abs Immature Granulocytes 0.04 0.00 - 0.07 K/uL    Comment: Performed at [facility], [address] [place], [place] [address]  Brain natriuretic peptide     Status: None   Collection Time: 06/18/24  4:26 PM  Result Value Ref Range   B Natriuretic Peptide 16.3 0.0 - 100.0 pg/mL    Comment: Performed at [facility], [address] [place], [place] [address]  Troponin I (High Sensitivity)     Status: None   Collection Time: 06/18/24  4:26 PM  Result Value Ref Range   Troponin I (High Sensitivity) 6 <18 ng/L    Comment: (NOTE) Elevated high sensitivity troponin I (hsTnI) values and significant  changes across serial measurements may suggest ACS but many other  chronic and acute conditions are known to elevate hsTnI results.  Refer to the Links section for chest pain algorithms and additional  guidance. Performed at [facility], [address] [place], [place] [address]   Lactic acid, plasma     Status: None   Collection Time: 06/18/24  5:50 PM  Result Value Ref Range   Lactic Acid, Venous 1.2 0.5 - 1.9 mmol/L    Comment: Performed at [facility], [address] [place], [place] [address]  Blood Culture (routine x 2)     Status: None   Collection Time: 06/18/24  5:50 PM   Specimen: BLOOD  Result Value Ref Range   Specimen Description BLOOD BLOOD LEFT FOREARM    Special Requests      BOTTLES DRAWN AEROBIC AND ANAEROBIC Blood Culture adequate volume   Culture      NO GROWTH 5 DAYS Performed at [facility], [address], [place], [place] [address]    Report Status 06/23/2024 FINAL   Troponin I (High Sensitivity)     Status: None   Collection Time: 06/18/24  5:50 PM  Result Value Ref Range   Troponin I (High Sensitivity) 6 <18 ng/L    Comment: (NOTE) Elevated high sensitivity troponin I (hsTnI) values and significant  changes  across serial measurements may suggest ACS but many other  chronic and acute conditions are known to elevate hsTnI results.  Refer to the Links section for chest pain algorithms and additional  guidance. Performed at [facility], [address] [place], [place] [address]   Blood Culture (routine x 2)     Status: None   Collection Time: 06/18/24  5:55 PM   Specimen: BLOOD RIGHT HAND  Result Value Ref Range   Specimen Description  BLOOD RIGHT HAND    Special Requests      BOTTLES DRAWN AEROBIC AND ANAEROBIC Blood Culture results may not be optimal due to an inadequate volume of blood received in culture bottles   Culture      NO GROWTH 5 DAYS Performed at [facility], [address] [place], [place] [address]    Report Status 06/23/2024 FINAL   Resp panel by RT-PCR (RSV, Flu A&B, Covid) Anterior Nasal Swab     Status: None   Collection Time: 06/18/24  6:15 PM   Specimen: Anterior Nasal Swab  Result Value Ref Range   SARS Coronavirus 2 by RT PCR NEGATIVE NEGATIVE    Comment: (NOTE) SARS-CoV-2 target nucleic acids are NOT DETECTED.  The SARS-CoV-2 RNA is generally detectable in upper respiratory specimens during the acute phase of infection. The lowest concentration of SARS-CoV-2 viral copies this assay can detect is 138 copies/mL. A negative result does not preclude SARS-Cov-2 infection and should not be used as the sole basis for treatment or other patient management decisions. A negative result may occur with  improper specimen collection/handling, submission of specimen other than nasopharyngeal swab, presence of viral mutation(s) within the areas targeted by this assay, and inadequate number of viral copies(<138 copies/mL). A negative result must be combined with clinical observations, patient history, and epidemiological information. The expected result is Negative.  Fact Sheet for Patients:  BloggerCourse.com  Fact Sheet  for Healthcare Providers:  SeriousBroker.it  This test is no t yet approved or cleared by the [place]  FDA and  has been authorized for detection and/or diagnosis of SARS-CoV-2 by FDA under an Emergency Use Authorization (EUA). This EUA will remain  in effect (meaning this test can be used) for the duration of the COVID-19 declaration under Section 564(b)(1) of the Act, 21 U.S.C.section 360bbb-3(b)(1), unless the authorization is terminated  or revoked sooner.       Influenza A by PCR NEGATIVE NEGATIVE   Influenza B by PCR NEGATIVE NEGATIVE    Comment: (NOTE) The Xpert Xpress SARS-CoV-2/FLU/RSV plus assay is intended as an aid in the diagnosis of influenza from Nasopharyngeal swab specimens and should not be used as a sole basis for treatment. Nasal washings and aspirates are unacceptable for Xpert Xpress SARS-CoV-2/FLU/RSV testing.  Fact Sheet for Patients: BloggerCourse.com  Fact Sheet for Healthcare Providers: SeriousBroker.it  This test is not yet approved or cleared by the [place]  FDA and has been authorized for detection and/or diagnosis of SARS-CoV-2 by FDA under an Emergency Use Authorization (EUA). This EUA will remain in effect (meaning this test can be used) for the duration of the COVID-19 declaration under Section 564(b)(1) of the Act, 21 U.S.C. section 360bbb-3(b)(1), unless the authorization is terminated or revoked.     Resp Syncytial Virus by PCR NEGATIVE NEGATIVE    Comment: (NOTE) Fact Sheet for Patients: BloggerCourse.com  Fact Sheet for Healthcare Providers: SeriousBroker.it  This test is not yet approved or cleared by the [place]  FDA and has been authorized for detection and/or diagnosis of SARS-CoV-2 by FDA under an Emergency Use Authorization (EUA). This EUA will remain in effect (meaning this test can be  used) for the duration of the COVID-19 declaration under Section 564(b)(1) of the Act, 21 U.S.C. section 360bbb-3(b)(1), unless the authorization is terminated or revoked.  Performed at [facility], [address] [place], [place] [address]   Urinalysis, w/ Reflex to Culture (Infection Suspected) -Urine, Clean Catch     Status: Abnormal  Collection Time: 06/18/24  6:15 PM  Result Value Ref Range   Specimen Source URINE, CLEAN CATCH    Color, Urine YELLOW (A) YELLOW   APPearance CLEAR (A) CLEAR   Specific Gravity, Urine 1.012 1.005 - 1.030   pH 6.0 5.0 - 8.0   Glucose, UA NEGATIVE NEGATIVE mg/dL   Hgb urine dipstick SMALL (A) NEGATIVE   Bilirubin Urine NEGATIVE NEGATIVE   Ketones, ur 20 (A) NEGATIVE mg/dL   Protein, ur NEGATIVE NEGATIVE mg/dL   Nitrite NEGATIVE NEGATIVE   Leukocytes,Ua NEGATIVE NEGATIVE   RBC / HPF 0-5 0 - 5 RBC/hpf   WBC, UA 0-5 0 - 5 WBC/hpf    Comment:        Reflex urine culture not performed if WBC <=10, OR if Squamous epithelial cells >5. If Squamous epithelial cells >5 suggest recollection.    Bacteria, UA RARE (A) NONE SEEN   Squamous Epithelial / HPF 0-5 0 - 5 /HPF   Mucus PRESENT    Hyaline Casts, UA PRESENT     Comment: Performed at [facility], [address], [place], [place] [address]      Assessment & Plan:  Continue taking medications as prescribed. Routine labs ordered today. Anemia labs ordered and patient given occult stool cards. Cologaurd ordered for colon cancer screening. Discuss low dose chest CT at next visit. Problem List Items Addressed This Visit     Rheumatoid arthritis involving multiple sites (HCC)   Relevant Medications   Upadacitinib ER (RINVOQ) 15 MG TB24   Prediabetes   Relevant Orders   CMP14+EGFR   Hemoglobin A1c   Mixed hyperlipidemia - Primary   Relevant Orders   Lipid Panel w/o Chol/HDL Ratio   Gastroesophageal reflux disease without esophagitis   Absolute anemia   Relevant  Medications   folic acid  (FOLVITE ) 1 MG tablet   Other Relevant Orders   Iron, TIBC and Ferritin Panel   CBC with Diff   Folate   Vitamin B12   Other Visit Diagnoses       Colon cancer screening       Relevant Orders   Cologuard     Fatigue, unspecified type           Return in about 2 weeks (around 08/18/2024).   Total time spent: 30 minutes  FERNAND FREDY RAMAN, MD  08/04/2024   This document may have been prepared by [facility] Voice Recognition software and as such may include unintentional dictation errors.

## 2024-08-04 NOTE — Progress Notes (Deleted)
 Established Patient Office Visit  Subjective:  Patient ID: Lori Banks, female    DOB: [birth year]  Age: 63 y.o. MRN: [id number]  Chief Complaint  Patient presents with   Establish Care    NPE    HPI  No other concerns at this time.   Past Medical History:  Diagnosis Date   COPD (chronic obstructive pulmonary disease) (HCC)    History of kidney stones    Kidney stone    Osteoporosis    Pre-diabetes    Rheumatoid arthritis (HCC)     Past Surgical History:  Procedure Laterality Date   APPENDECTOMY     CESAREAN SECTION     CHOLECYSTECTOMY     CYSTOSCOPY WITH STENT PLACEMENT Left 06/17/2022   Procedure: CYSTOSCOPY WITH STENT PLACEMENT;  Surgeon: Francisca Redell BROCKS, MD;  Location: [facility];  Service: Urology;  Laterality: Left;   CYSTOSCOPY/URETEROSCOPY/HOLMIUM LASER/STENT PLACEMENT Left 07/03/2022   Procedure: CYSTOSCOPY/URETEROSCOPY/HOLMIUM LASER/STENT EXCHANGE;  Surgeon: Francisca Redell BROCKS, MD;  Location: [facility];  Service: Urology;  Laterality: Left;   MULTIPLE TOOTH EXTRACTIONS     all teeth have been removed, patient wears dentures    ORIF ANKLE FRACTURE Right 03/09/2022   Procedure: OPEN REDUCTION INTERNAL FIXATION (ORIF) ANKLE FRACTURE;  Surgeon: Leora Lynwood SAUNDERS, MD;  Location: [facility];  Service: Orthopedics;  Laterality: Right;   TUBAL LIGATION      Social History   Socioeconomic History   Marital status: Legally Separated    Spouse name: Not on file   Number of children: Not on file   Years of education: Not on file   Highest education level: Not on file  Occupational History   Not on file  Tobacco Use   Smoking status: Former    Current packs/day: 0.00    Types: Cigarettes    Quit date: 04/2019    Years since quitting: 5.3    Passive exposure: Past   Smokeless tobacco: Never  Vaping Use   Vaping status: Never Used  Substance and Sexual Activity   Alcohol use: Yes    Comment: occassionally   Drug use: Yes    Types: Marijuana    Comment: 5  days weekly    Sexual activity: Not Currently  Other Topics Concern   Not on file  Social History Narrative   Not on file   Social Drivers of Health   Financial Resource Strain: Not on file  Food Insecurity: Not on file  Transportation Needs: Not on file  Physical Activity: Not on file  Stress: Not on file  Social Connections: Not on file  Intimate Partner Violence: Not At Risk (07/03/2024)   [facility]   Humiliation, Afraid, Rape, and Kick questionnaire    Within the last year, have you been afraid of your partner or ex-partner?: No    Within the last year, have you been humiliated or emotionally abused in other ways by your partner or ex-partner?: No    Within the last year, have you been kicked, hit, slapped, or otherwise physically hurt by your partner or ex-partner?: No    Within the last year, have you been raped or forced to have any kind of sexual activity by your partner or ex-partner?: No    Family History  Problem Relation Age of Onset   Diabetes Mother    Osteoarthritis Mother    Heart disease Father    Diabetes Sister    Fibromyalgia Sister    Meniere's disease Brother  Healthy Son    Healthy Son    Seizures Son    Healthy Daughter    Epilepsy Nephew     No Known Allergies  Outpatient Medications Prior to Visit  Medication Sig   alendronate (FOSAMAX) 70 MG tablet Take 70 mg by mouth once a week.   atorvastatin  (LIPITOR) 10 MG tablet Take 10 mg by mouth at bedtime.   diclofenac  Sodium (VOLTAREN ) 1 % GEL Apply topically 2 (two) times daily as needed (pain).   folic acid  (FOLVITE ) 1 MG tablet Take 2 mg by mouth daily.   methotrexate  (RHEUMATREX) 2.5 MG tablet Take 7.5mg  in the morning and with dinner one day per week.   Upadacitinib ER (RINVOQ) 15 MG TB24 Take 1 tablet by mouth daily.   valACYclovir  (VALTREX ) 500 MG tablet Take 1 tablet (500 mg total) by mouth daily.   [DISCONTINUED] loperamide  (IMODIUM  A-D) 2 MG tablet Take 1 tablet (2  mg total) by mouth 4 (four) times daily as needed for diarrhea or loose stools. (Patient not taking: Reported on 08/04/2024)   [DISCONTINUED] ondansetron  (ZOFRAN -ODT) 4 MG disintegrating tablet Take 1 tablet (4 mg total) by mouth every 8 (eight) hours as needed. (Patient not taking: Reported on 08/04/2024)   No facility-administered medications prior to visit.    ROS     Objective:   BP 108/82   Pulse (!) 54   Ht 5' 6 (1.676 m)   Wt 192 lb 6.4 oz (87.3 kg)   SpO2 96%   BMI 31.05 kg/m   Vitals:   08/04/24 1254  BP: 108/82  Pulse: (!) 54  Height: 5' 6 (1.676 m)  Weight: 192 lb 6.4 oz (87.3 kg)  SpO2: 96%  BMI (Calculated): 31.07    Physical Exam   No results found for any visits on 08/04/24.  Recent Results (from the past 2160 hours)  Lactic acid, plasma     Status: None   Collection Time: 06/18/24  4:26 PM  Result Value Ref Range   Lactic Acid, Venous 1.7 0.5 - 1.9 mmol/L    Comment: Performed at [facility], [address] [place], [place] [address]  Comprehensive metabolic panel     Status: Abnormal   Collection Time: 06/18/24  4:26 PM  Result Value Ref Range   Sodium 133 (L) 135 - 145 mmol/L   Potassium 3.6 3.5 - 5.1 mmol/L   Chloride 102 98 - 111 mmol/L   CO2 22 22 - 32 mmol/L   Glucose, Bld 116 (H) 70 - 99 mg/dL    Comment: Glucose reference range applies only to samples taken after fasting for at least 8 hours.   BUN 18 8 - 23 mg/dL   Creatinine, Ser 9.19 0.44 - 1.00 mg/dL   Calcium  9.1 8.9 - 10.3 mg/dL   Total Protein 6.0 (L) 6.5 - 8.1 g/dL   Albumin 3.1 (L) 3.5 - 5.0 g/dL   AST 22 15 - 41 U/L   ALT 19 0 - 44 U/L   Alkaline Phosphatase 25 (L) 38 - 126 U/L   Total Bilirubin 1.0 0.0 - 1.2 mg/dL   GFR, Estimated >39 >39 mL/min    Comment: (NOTE) Calculated using the CKD-EPI Creatinine Equation (2021)    Anion gap 9 5 - 15    Comment: Performed at [facility], [address] [place], [place] [address]  CBC with Differential      Status: Abnormal   Collection Time: 06/18/24  4:26 PM  Result Value Ref  Range   WBC 8.9 4.0 - 10.5 K/uL   RBC 3.65 (L) 3.87 - 5.11 MIL/uL   Hemoglobin 11.4 (L) 12.0 - 15.0 g/dL   HCT 65.7 (L) 63.9 - 53.9 %   MCV 93.7 80.0 - 100.0 fL   MCH 31.2 26.0 - 34.0 pg   MCHC 33.3 30.0 - 36.0 g/dL   RDW 85.0 88.4 - 84.4 %   Platelets 207 150 - 400 K/uL   nRBC 0.0 0.0 - 0.2 %   Neutrophils Relative % 83 %   Neutro Abs 7.4 1.7 - 7.7 K/uL   Lymphocytes Relative 8 %   Lymphs Abs 0.7 0.7 - 4.0 K/uL   Monocytes Relative 9 %   Monocytes Absolute 0.8 0.1 - 1.0 K/uL   Eosinophils Relative 0 %   Eosinophils Absolute 0.0 0.0 - 0.5 K/uL   Basophils Relative 0 %   Basophils Absolute 0.0 0.0 - 0.1 K/uL   WBC Morphology MORPHOLOGY UNREMARKABLE    RBC Morphology MORPHOLOGY UNREMARKABLE    Smear Review Normal platelet morphology    Immature Granulocytes 0 %   Abs Immature Granulocytes 0.04 0.00 - 0.07 K/uL    Comment: Performed at [facility], [address] [place], [place] [address]  Brain natriuretic peptide     Status: None   Collection Time: 06/18/24  4:26 PM  Result Value Ref Range   B Natriuretic Peptide 16.3 0.0 - 100.0 pg/mL    Comment: Performed at [facility], [address] [place], [place] [address]  Troponin I (High Sensitivity)     Status: None   Collection Time: 06/18/24  4:26 PM  Result Value Ref Range   Troponin I (High Sensitivity) 6 <18 ng/L    Comment: (NOTE) Elevated high sensitivity troponin I (hsTnI) values and significant  changes across serial measurements may suggest ACS but many other  chronic and acute conditions are known to elevate hsTnI results.  Refer to the Links section for chest pain algorithms and additional  guidance. Performed at [facility], [address] [place], [place] [address]   Lactic acid, plasma     Status: None   Collection Time: 06/18/24  5:50 PM  Result Value Ref Range   Lactic Acid, Venous 1.2 0.5 - 1.9  mmol/L    Comment: Performed at [facility], [address] [place], [place] [address]  Blood Culture (routine x 2)     Status: None   Collection Time: 06/18/24  5:50 PM   Specimen: BLOOD  Result Value Ref Range   Specimen Description BLOOD BLOOD LEFT FOREARM    Special Requests      BOTTLES DRAWN AEROBIC AND ANAEROBIC Blood Culture adequate volume   Culture      NO GROWTH 5 DAYS Performed at [facility], [address], [place], [place] [address]    Report Status 06/23/2024 FINAL   Troponin I (High Sensitivity)     Status: None   Collection Time: 06/18/24  5:50 PM  Result Value Ref Range   Troponin I (High Sensitivity) 6 <18 ng/L    Comment: (NOTE) Elevated high sensitivity troponin I (hsTnI) values and significant  changes across serial measurements may suggest ACS but many other  chronic and acute conditions are known to elevate hsTnI results.  Refer to the Links section for chest pain algorithms and additional  guidance. Performed at [facility], [address], [place], [place] [address]   Blood Culture (routine x 2)  Status: None   Collection Time: 06/18/24  5:55 PM   Specimen: BLOOD RIGHT HAND  Result Value Ref Range   Specimen Description BLOOD RIGHT HAND    Special Requests      BOTTLES DRAWN AEROBIC AND ANAEROBIC Blood Culture results may not be optimal due to an inadequate volume of blood received in culture bottles   Culture      NO GROWTH 5 DAYS Performed at [facility], [address] [place], [place] [address]    Report Status 06/23/2024 FINAL   Resp panel by RT-PCR (RSV, Flu A&B, Covid) Anterior Nasal Swab     Status: None   Collection Time: 06/18/24  6:15 PM   Specimen: Anterior Nasal Swab  Result Value Ref Range   SARS Coronavirus 2 by RT PCR NEGATIVE NEGATIVE    Comment: (NOTE) SARS-CoV-2 target nucleic acids are NOT DETECTED.  The SARS-CoV-2 RNA is generally detectable in upper respiratory specimens  during the acute phase of infection. The lowest concentration of SARS-CoV-2 viral copies this assay can detect is 138 copies/mL. A negative result does not preclude SARS-Cov-2 infection and should not be used as the sole basis for treatment or other patient management decisions. A negative result may occur with  improper specimen collection/handling, submission of specimen other than nasopharyngeal swab, presence of viral mutation(s) within the areas targeted by this assay, and inadequate number of viral copies(<138 copies/mL). A negative result must be combined with clinical observations, patient history, and epidemiological information. The expected result is Negative.  Fact Sheet for Patients:  BloggerCourse.com  Fact Sheet for Healthcare Providers:  SeriousBroker.it  This test is no t yet approved or cleared by the [place]  FDA and  has been authorized for detection and/or diagnosis of SARS-CoV-2 by FDA under an Emergency Use Authorization (EUA). This EUA will remain  in effect (meaning this test can be used) for the duration of the COVID-19 declaration under Section 564(b)(1) of the Act, 21 U.S.C.section 360bbb-3(b)(1), unless the authorization is terminated  or revoked sooner.       Influenza A by PCR NEGATIVE NEGATIVE   Influenza B by PCR NEGATIVE NEGATIVE    Comment: (NOTE) The Xpert Xpress SARS-CoV-2/FLU/RSV plus assay is intended as an aid in the diagnosis of influenza from Nasopharyngeal swab specimens and should not be used as a sole basis for treatment. Nasal washings and aspirates are unacceptable for Xpert Xpress SARS-CoV-2/FLU/RSV testing.  Fact Sheet for Patients: BloggerCourse.com  Fact Sheet for Healthcare Providers: SeriousBroker.it  This test is not yet approved or cleared by the [place]  FDA and has been authorized for detection and/or diagnosis  of SARS-CoV-2 by FDA under an Emergency Use Authorization (EUA). This EUA will remain in effect (meaning this test can be used) for the duration of the COVID-19 declaration under Section 564(b)(1) of the Act, 21 U.S.C. section 360bbb-3(b)(1), unless the authorization is terminated or revoked.     Resp Syncytial Virus by PCR NEGATIVE NEGATIVE    Comment: (NOTE) Fact Sheet for Patients: BloggerCourse.com  Fact Sheet for Healthcare Providers: SeriousBroker.it  This test is not yet approved or cleared by the [place]  FDA and has been authorized for detection and/or diagnosis of SARS-CoV-2 by FDA under an Emergency Use Authorization (EUA). This EUA will remain in effect (meaning this test can be used) for the duration of the COVID-19 declaration under Section 564(b)(1) of the Act, 21 U.S.C. section 360bbb-3(b)(1), unless the authorization is terminated or revoked.  Performed at [facility],  [address], [place], [place] [address]   Urinalysis, w/ Reflex to Culture (Infection Suspected) -Urine, Clean Catch     Status: Abnormal   Collection Time: 06/18/24  6:15 PM  Result Value Ref Range   Specimen Source URINE, CLEAN CATCH    Color, Urine YELLOW (A) YELLOW   APPearance CLEAR (A) CLEAR   Specific Gravity, Urine 1.012 1.005 - 1.030   pH 6.0 5.0 - 8.0   Glucose, UA NEGATIVE NEGATIVE mg/dL   Hgb urine dipstick SMALL (A) NEGATIVE   Bilirubin Urine NEGATIVE NEGATIVE   Ketones, ur 20 (A) NEGATIVE mg/dL   Protein, ur NEGATIVE NEGATIVE mg/dL   Nitrite NEGATIVE NEGATIVE   Leukocytes,Ua NEGATIVE NEGATIVE   RBC / HPF 0-5 0 - 5 RBC/hpf   WBC, UA 0-5 0 - 5 WBC/hpf    Comment:        Reflex urine culture not performed if WBC <=10, OR if Squamous epithelial cells >5. If Squamous epithelial cells >5 suggest recollection.    Bacteria, UA RARE (A) NONE SEEN   Squamous Epithelial / HPF 0-5 0 - 5 /HPF   Mucus PRESENT     Hyaline Casts, UA PRESENT     Comment: Performed at [facility], [address] [place], [place] [address]      Assessment & Plan:   Problem List Items Addressed This Visit     Prediabetes   Other Visit Diagnoses       Colon cancer screening    -  Primary       Return in about 2 weeks (around 08/18/2024).   Total time spent: {AMA time spent:29001} minutes  FERNAND FREDY RAMAN, MD  08/04/2024   This document may have been prepared by [facility] Voice Recognition software and as such may include unintentional dictation errors.

## 2024-08-05 LAB — CMP14+EGFR
ALT: 25 IU/L (ref 0–32)
AST: 16 IU/L (ref 0–40)
Albumin: 4.8 g/dL (ref 3.9–4.9)
Alkaline Phosphatase: 67 IU/L (ref 44–121)
BUN/Creatinine Ratio: 14 (ref 12–28)
BUN: 15 mg/dL (ref 8–27)
Bilirubin Total: 0.4 mg/dL (ref 0.0–1.2)
CO2: 23 mmol/L (ref 20–29)
Calcium: 9.6 mg/dL (ref 8.7–10.3)
Chloride: 101 mmol/L (ref 96–106)
Creatinine, Ser: 1.11 mg/dL — ABNORMAL HIGH (ref 0.57–1.00)
Globulin, Total: 2.9 g/dL (ref 1.5–4.5)
Glucose: 103 mg/dL — ABNORMAL HIGH (ref 70–99)
Potassium: 4.7 mmol/L (ref 3.5–5.2)
Sodium: 139 mmol/L (ref 134–144)
Total Protein: 7.7 g/dL (ref 6.0–8.5)
eGFR: 56 mL/min/1.73 — ABNORMAL LOW (ref >59)

## 2024-08-05 LAB — CBC WITH DIFFERENTIAL/PLATELET
Basophils Absolute: 0 x10E3/uL (ref 0.0–0.2)
Basos: 0 %
EOS (ABSOLUTE): 0.1 x10E3/uL (ref 0.0–0.4)
Eos: 2 %
Hematocrit: 38.1 % (ref 34.0–46.6)
Hemoglobin: 11.9 g/dL (ref 11.1–15.9)
Immature Grans (Abs): 0 x10E3/uL (ref 0.0–0.1)
Immature Granulocytes: 0 %
Lymphocytes Absolute: 2 x10E3/uL (ref 0.7–3.1)
Lymphs: 36 %
MCH: 30.5 pg (ref 26.6–33.0)
MCHC: 31.2 g/dL — ABNORMAL LOW (ref 31.5–35.7)
MCV: 98 fL — ABNORMAL HIGH (ref 79–97)
Monocytes Absolute: 0.6 x10E3/uL (ref 0.1–0.9)
Monocytes: 10 %
Neutrophils Absolute: 2.8 x10E3/uL (ref 1.4–7.0)
Neutrophils: 51 %
Platelets: 321 x10E3/uL (ref 150–450)
RBC: 3.9 x10E6/uL (ref 3.77–5.28)
RDW: 15.1 % (ref 11.7–15.4)
WBC: 5.5 x10E3/uL (ref 3.4–10.8)

## 2024-08-05 LAB — FOLATE: Folate: >20 ng/mL (ref >3.0)

## 2024-08-05 LAB — LIPID PANEL W/O CHOL/HDL RATIO
Cholesterol, Total: 202 mg/dL — ABNORMAL HIGH (ref 100–199)
HDL: 44 mg/dL (ref >39)
LDL Chol Calc (NIH): 139 mg/dL — ABNORMAL HIGH (ref 0–99)
Triglycerides: 107 mg/dL (ref 0–149)
VLDL Cholesterol Cal: 19 mg/dL (ref 5–40)

## 2024-08-05 LAB — IRON,TIBC AND FERRITIN PANEL
Ferritin: 530 ng/mL — ABNORMAL HIGH (ref 15–150)
Iron Saturation: 27 % (ref 15–55)
Iron: 84 ug/dL (ref 27–139)
Total Iron Binding Capacity: 311 ug/dL (ref 250–450)
UIBC: 227 ug/dL (ref 118–369)

## 2024-08-05 LAB — VITAMIN B12: Vitamin B-12: 469 pg/mL (ref 232–1245)

## 2024-08-05 LAB — HEMOGLOBIN A1C
Est. average glucose Bld gHb Est-mCnc: 120 mg/dL
Hgb A1c MFr Bld: 5.8 % — ABNORMAL HIGH (ref 4.8–5.6)

## 2024-08-07 ENCOUNTER — Ambulatory Visit: Payer: Self-pay | Admitting: Internal Medicine

## 2024-08-07 ENCOUNTER — Other Ambulatory Visit: Payer: Self-pay | Admitting: Internal Medicine

## 2024-08-07 ENCOUNTER — Encounter: Payer: Self-pay | Admitting: Internal Medicine

## 2024-08-07 DIAGNOSIS — E782 Mixed hyperlipidemia: Secondary | ICD-10-CM

## 2024-08-07 DIAGNOSIS — Z13820 Encounter for screening for osteoporosis: Secondary | ICD-10-CM

## 2024-08-07 MED ORDER — ATORVASTATIN CALCIUM 20 MG PO TABS: 20.0000 mg | ORAL_TABLET | Freq: Every day | ORAL | 3 refills | Status: AC

## 2024-08-07 NOTE — Progress Notes (Signed)
 Patient notified

## 2024-08-18 ENCOUNTER — Ambulatory Visit: Payer: Medicare (Managed Care) | Admitting: Internal Medicine

## 2024-08-18 ENCOUNTER — Encounter: Payer: Self-pay | Admitting: Internal Medicine

## 2024-08-18 VITALS — BP 112/76 | HR 62 | Ht 66.0 in | Wt 193.4 lb

## 2024-08-18 DIAGNOSIS — R7303 Prediabetes: Secondary | ICD-10-CM

## 2024-08-18 DIAGNOSIS — Z13820 Encounter for screening for osteoporosis: Secondary | ICD-10-CM | POA: Diagnosis not present

## 2024-08-18 DIAGNOSIS — Z122 Encounter for screening for malignant neoplasm of respiratory organs: Secondary | ICD-10-CM | POA: Diagnosis not present

## 2024-08-18 DIAGNOSIS — E782 Mixed hyperlipidemia: Secondary | ICD-10-CM

## 2024-08-18 DIAGNOSIS — M0579 Rheumatoid arthritis with rheumatoid factor of multiple sites without organ or systems involvement: Secondary | ICD-10-CM

## 2024-08-18 MED ORDER — DICLOFENAC SODIUM 1 % EX GEL: 2.0000 g | Freq: Two times a day (BID) | CUTANEOUS | 1 refills | Status: AC | PRN

## 2024-08-18 NOTE — Progress Notes (Signed)
 Established Patient Office Visit  Subjective:  Patient ID: Lori Banks, female    DOB: [birth year]  Age: 63 y.o. MRN: [id number]  Chief Complaint  Patient presents with   Follow-up    2 week follow up    Patient is here today to follow up to discuss labs. She reports she has been traveling recently but otherwise feels well. She endorses needing to eat a better diet and exercise as tolerated. She states she increased her Lipitor dosage as recommended. She has mailed off her cologaurd since her previous visit. Her DEXA scan was last in 2023 and is due for one this year. She takes fosamax and had not been taking calcium  and vitamin D  supplement daily; encouraged her to start taking at least Calcium  600 international units with vitamin D  to enhance calcium  absorption. Her anemia has improved since her last CBC; could be related to her RA as her iron stores were normal but had a high ferritin level. She reports she will get her annual flu vaccine and pneumonia vaccine at the pharmacy. Discussed her smoking history and smoking cessation 5 years ago; encouraged patient to complete low dose chest CT for lung cancer screening. Will order low dose chest CT. She denies any complaints at today's visit.     No other concerns at this time.   Past Medical History:  Diagnosis Date   COPD (chronic obstructive pulmonary disease) (HCC)    History of kidney stones    Kidney stone    Osteoporosis    Pre-diabetes    Rheumatoid arthritis (HCC)     Past Surgical History:  Procedure Laterality Date   APPENDECTOMY     CESAREAN SECTION     CHOLECYSTECTOMY     CYSTOSCOPY WITH STENT PLACEMENT Left 06/17/2022   Procedure: CYSTOSCOPY WITH STENT PLACEMENT;  Surgeon: Francisca Redell BROCKS, MD;  Location: [facility];  Service: Urology;  Laterality: Left;   CYSTOSCOPY/URETEROSCOPY/HOLMIUM LASER/STENT PLACEMENT Left 07/03/2022   Procedure: CYSTOSCOPY/URETEROSCOPY/HOLMIUM LASER/STENT EXCHANGE;  Surgeon: Francisca Redell BROCKS, MD;  Location: [facility];  Service: Urology;  Laterality: Left;   MULTIPLE TOOTH EXTRACTIONS     all teeth have been removed, patient wears dentures    ORIF ANKLE FRACTURE Right 03/09/2022   Procedure: OPEN REDUCTION INTERNAL FIXATION (ORIF) ANKLE FRACTURE;  Surgeon: Leora Lynwood SAUNDERS, MD;  Location: [facility];  Service: Orthopedics;  Laterality: Right;   TUBAL LIGATION      Social History   Socioeconomic History   Marital status: Legally Separated    Spouse name: Not on file   Number of children: Not on file   Years of education: Not on file   Highest education level: Not on file  Occupational History   Not on file  Tobacco Use   Smoking status: Former    Current packs/day: 0.00    Types: Cigarettes    Quit date: 04/2019    Years since quitting: 5.3    Passive exposure: Past   Smokeless tobacco: Never  Vaping Use   Vaping status: Never Used  Substance and Sexual Activity   Alcohol use: Yes    Comment: occassionally   Drug use: Yes    Types: Marijuana    Comment: 5 days weekly    Sexual activity: Not Currently  Other Topics Concern   Not on file  Social History Narrative   Not on file   Social Drivers of Health   Financial Resource Strain: Not on file  Food Insecurity: Not on file  Transportation Needs: Not on file  Physical Activity: Not on file  Stress: Not on file  Social Connections: Not on file  Intimate Partner Violence: Not At Risk (07/03/2024)   Received from [facility]   Humiliation, Afraid, Rape, and Kick questionnaire    Within the last year, have you been afraid of your partner or ex-partner?: No    Within the last year, have you been humiliated or emotionally abused in other ways by your partner or ex-partner?: No    Within the last year, have you been kicked, hit, slapped, or otherwise physically hurt by your partner or ex-partner?: No    Within the last year, have you been raped or forced to have any kind of sexual activity by your partner  or ex-partner?: No    Family History  Problem Relation Age of Onset   Diabetes Mother    Osteoarthritis Mother    Heart disease Father    Diabetes Sister    Fibromyalgia Sister    Meniere's disease Brother    Healthy Son    Healthy Son    Seizures Son    Healthy Daughter    Epilepsy Nephew     No Known Allergies  Outpatient Medications Prior to Visit  Medication Sig   alendronate (FOSAMAX) 70 MG tablet Take 70 mg by mouth once a week.   atorvastatin  (LIPITOR) 20 MG tablet Take 1 tablet (20 mg total) by mouth daily.   folic acid  (FOLVITE ) 1 MG tablet Take 2 mg by mouth daily.   methotrexate  (RHEUMATREX) 2.5 MG tablet Take 7.5mg  in the morning and with dinner one day per week.   Upadacitinib ER (RINVOQ) 15 MG TB24 Take 1 tablet by mouth daily.   [DISCONTINUED] diclofenac  Sodium (VOLTAREN ) 1 % GEL Apply topically 2 (two) times daily as needed (pain).   No facility-administered medications prior to visit.    Review of Systems  Constitutional: Negative.   HENT: Negative.    Eyes: Negative.   Respiratory: Negative.  Negative for cough and shortness of breath.   Cardiovascular: Negative.  Negative for chest pain, palpitations and leg swelling.  Gastrointestinal: Negative.  Negative for abdominal pain, constipation, diarrhea, heartburn, nausea and vomiting.  Genitourinary: Negative.  Negative for dysuria and flank pain.  Musculoskeletal: Negative.  Negative for joint pain and myalgias.  Skin: Negative.   Neurological: Negative.  Negative for dizziness and headaches.  Endo/Heme/Allergies: Negative.   Psychiatric/Behavioral: Negative.  Negative for depression and suicidal ideas. The patient is not nervous/anxious.        Objective:   BP 112/76   Pulse 62   Ht 5' 6 (1.676 m)   Wt 193 lb 6.4 oz (87.7 kg)   SpO2 96%   BMI 31.22 kg/m   Vitals:   08/18/24 1331  BP: 112/76  Pulse: 62  Height: 5' 6 (1.676 m)  Weight: 193 lb 6.4 oz (87.7 kg)  SpO2: 96%  BMI  (Calculated): 31.23    Physical Exam Vitals and nursing note reviewed.  Constitutional:      Appearance: Normal appearance.  HENT:     Head: Normocephalic and atraumatic.     Nose: Nose normal.     Mouth/Throat:     Mouth: Mucous membranes are moist.     Pharynx: Oropharynx is clear.  Eyes:     Conjunctiva/sclera: Conjunctivae normal.     Pupils: Pupils are equal, round, and reactive to light.  Cardiovascular:     Rate and Rhythm: Normal rate and  regular rhythm.     Pulses: Normal pulses.     Heart sounds: Normal heart sounds. No murmur heard. Pulmonary:     Effort: Pulmonary effort is normal.     Breath sounds: Normal breath sounds. No wheezing.  Abdominal:     General: Bowel sounds are normal.     Palpations: Abdomen is soft.     Tenderness: There is no abdominal tenderness. There is no right CVA tenderness or left CVA tenderness.  Musculoskeletal:        General: Normal range of motion.     Cervical back: Normal range of motion.     Right lower leg: No edema.     Left lower leg: No edema.  Skin:    General: Skin is warm and dry.  Neurological:     General: No focal deficit present.     Mental Status: She is alert and oriented to person, place, and time.  Psychiatric:        Mood and Affect: Mood normal.        Behavior: Behavior normal.      No results found for any visits on 08/18/24.  Recent Results (from the past 2160 hours)  Lactic acid, plasma     Status: None   Collection Time: 06/18/24  4:26 PM  Result Value Ref Range   Lactic Acid, Venous 1.7 0.5 - 1.9 mmol/L    Comment: Performed at [facility], [address] [place], [place] [address]  Comprehensive metabolic panel     Status: Abnormal   Collection Time: 06/18/24  4:26 PM  Result Value Ref Range   Sodium 133 (L) 135 - 145 mmol/L   Potassium 3.6 3.5 - 5.1 mmol/L   Chloride 102 98 - 111 mmol/L   CO2 22 22 - 32 mmol/L   Glucose, Bld 116 (H) 70 - 99 mg/dL    Comment: Glucose reference  range applies only to samples taken after fasting for at least 8 hours.   BUN 18 8 - 23 mg/dL   Creatinine, Ser 9.19 0.44 - 1.00 mg/dL   Calcium  9.1 8.9 - 10.3 mg/dL   Total Protein 6.0 (L) 6.5 - 8.1 g/dL   Albumin 3.1 (L) 3.5 - 5.0 g/dL   AST 22 15 - 41 U/L   ALT 19 0 - 44 U/L   Alkaline Phosphatase 25 (L) 38 - 126 U/L   Total Bilirubin 1.0 0.0 - 1.2 mg/dL   GFR, Estimated >39 >39 mL/min    Comment: (NOTE) Calculated using the CKD-EPI Creatinine Equation (2021)    Anion gap 9 5 - 15    Comment: Performed at [facility], [address] [place], [place] [address]  CBC with Differential     Status: Abnormal   Collection Time: 06/18/24  4:26 PM  Result Value Ref Range   WBC 8.9 4.0 - 10.5 K/uL   RBC 3.65 (L) 3.87 - 5.11 MIL/uL   Hemoglobin 11.4 (L) 12.0 - 15.0 g/dL   HCT 65.7 (L) 63.9 - 53.9 %   MCV 93.7 80.0 - 100.0 fL   MCH 31.2 26.0 - 34.0 pg   MCHC 33.3 30.0 - 36.0 g/dL   RDW 85.0 88.4 - 84.4 %   Platelets 207 150 - 400 K/uL   nRBC 0.0 0.0 - 0.2 %   Neutrophils Relative % 83 %   Neutro Abs 7.4 1.7 - 7.7 K/uL   Lymphocytes Relative 8 %   Lymphs Abs 0.7 0.7 - 4.0  K/uL   Monocytes Relative 9 %   Monocytes Absolute 0.8 0.1 - 1.0 K/uL   Eosinophils Relative 0 %   Eosinophils Absolute 0.0 0.0 - 0.5 K/uL   Basophils Relative 0 %   Basophils Absolute 0.0 0.0 - 0.1 K/uL   WBC Morphology MORPHOLOGY UNREMARKABLE    RBC Morphology MORPHOLOGY UNREMARKABLE    Smear Review Normal platelet morphology    Immature Granulocytes 0 %   Abs Immature Granulocytes 0.04 0.00 - 0.07 K/uL    Comment: Performed at [facility], [address] [place], [place] [address]  Brain natriuretic peptide     Status: None   Collection Time: 06/18/24  4:26 PM  Result Value Ref Range   B Natriuretic Peptide 16.3 0.0 - 100.0 pg/mL    Comment: Performed at [facility], [address] [place], [place] [address]  Troponin I (High Sensitivity)     Status: None   Collection  Time: 06/18/24  4:26 PM  Result Value Ref Range   Troponin I (High Sensitivity) 6 <18 ng/L    Comment: (NOTE) Elevated high sensitivity troponin I (hsTnI) values and significant  changes across serial measurements may suggest ACS but many other  chronic and acute conditions are known to elevate hsTnI results.  Refer to the Links section for chest pain algorithms and additional  guidance. Performed at [facility], [address] [place], [place] [address]   Lactic acid, plasma     Status: None   Collection Time: 06/18/24  5:50 PM  Result Value Ref Range   Lactic Acid, Venous 1.2 0.5 - 1.9 mmol/L    Comment: Performed at [facility], [address] [place], [place] [address]  Blood Culture (routine x 2)     Status: None   Collection Time: 06/18/24  5:50 PM   Specimen: BLOOD  Result Value Ref Range   Specimen Description BLOOD BLOOD LEFT FOREARM    Special Requests      BOTTLES DRAWN AEROBIC AND ANAEROBIC Blood Culture adequate volume   Culture      NO GROWTH 5 DAYS Performed at [facility], [address] [place], [place] [address]    Report Status 06/23/2024 FINAL   Troponin I (High Sensitivity)     Status: None   Collection Time: 06/18/24  5:50 PM  Result Value Ref Range   Troponin I (High Sensitivity) 6 <18 ng/L    Comment: (NOTE) Elevated high sensitivity troponin I (hsTnI) values and significant  changes across serial measurements may suggest ACS but many other  chronic and acute conditions are known to elevate hsTnI results.  Refer to the Links section for chest pain algorithms and additional  guidance. Performed at [facility], [address] [place], [place] [address]   Blood Culture (routine x 2)     Status: None   Collection Time: 06/18/24  5:55 PM   Specimen: BLOOD RIGHT HAND  Result Value Ref Range   Specimen Description BLOOD RIGHT HAND    Special Requests      BOTTLES DRAWN AEROBIC AND ANAEROBIC Blood  Culture results may not be optimal due to an inadequate volume of blood received in culture bottles   Culture      NO GROWTH 5 DAYS Performed at [facility], [address] [place], [place] [address]    Report Status 06/23/2024 FINAL   Resp panel by RT-PCR (RSV, Flu A&B, Covid) Anterior Nasal Swab     Status:  None   Collection Time: 06/18/24  6:15 PM   Specimen: Anterior Nasal Swab  Result Value Ref Range   SARS Coronavirus 2 by RT PCR NEGATIVE NEGATIVE    Comment: (NOTE) SARS-CoV-2 target nucleic acids are NOT DETECTED.  The SARS-CoV-2 RNA is generally detectable in upper respiratory specimens during the acute phase of infection. The lowest concentration of SARS-CoV-2 viral copies this assay can detect is 138 copies/mL. A negative result does not preclude SARS-Cov-2 infection and should not be used as the sole basis for treatment or other patient management decisions. A negative result may occur with  improper specimen collection/handling, submission of specimen other than nasopharyngeal swab, presence of viral mutation(s) within the areas targeted by this assay, and inadequate number of viral copies(<138 copies/mL). A negative result must be combined with clinical observations, patient history, and epidemiological information. The expected result is Negative.  Fact Sheet for Patients:  BloggerCourse.com  Fact Sheet for Healthcare Providers:  SeriousBroker.it  This test is no t yet approved or cleared by the [place]  FDA and  has been authorized for detection and/or diagnosis of SARS-CoV-2 by FDA under an Emergency Use Authorization (EUA). This EUA will remain  in effect (meaning this test can be used) for the duration of the COVID-19 declaration under Section 564(b)(1) of the Act, 21 U.S.C.section 360bbb-3(b)(1), unless the authorization is terminated  or revoked sooner.       Influenza A by PCR NEGATIVE  NEGATIVE   Influenza B by PCR NEGATIVE NEGATIVE    Comment: (NOTE) The Xpert Xpress SARS-CoV-2/FLU/RSV plus assay is intended as an aid in the diagnosis of influenza from Nasopharyngeal swab specimens and should not be used as a sole basis for treatment. Nasal washings and aspirates are unacceptable for Xpert Xpress SARS-CoV-2/FLU/RSV testing.  Fact Sheet for Patients: BloggerCourse.com  Fact Sheet for Healthcare Providers: SeriousBroker.it  This test is not yet approved or cleared by the [place]  FDA and has been authorized for detection and/or diagnosis of SARS-CoV-2 by FDA under an Emergency Use Authorization (EUA). This EUA will remain in effect (meaning this test can be used) for the duration of the COVID-19 declaration under Section 564(b)(1) of the Act, 21 U.S.C. section 360bbb-3(b)(1), unless the authorization is terminated or revoked.     Resp Syncytial Virus by PCR NEGATIVE NEGATIVE    Comment: (NOTE) Fact Sheet for Patients: BloggerCourse.com  Fact Sheet for Healthcare Providers: SeriousBroker.it  This test is not yet approved or cleared by the [place]  FDA and has been authorized for detection and/or diagnosis of SARS-CoV-2 by FDA under an Emergency Use Authorization (EUA). This EUA will remain in effect (meaning this test can be used) for the duration of the COVID-19 declaration under Section 564(b)(1) of the Act, 21 U.S.C. section 360bbb-3(b)(1), unless the authorization is terminated or revoked.  Performed at [facility], [address] [place], [place] [address]   Urinalysis, w/ Reflex to Culture (Infection Suspected) -Urine, Clean Catch     Status: Abnormal   Collection Time: 06/18/24  6:15 PM  Result Value Ref Range   Specimen Source URINE, CLEAN CATCH    Color, Urine YELLOW (A) YELLOW   APPearance CLEAR (A) CLEAR   Specific  Gravity, Urine 1.012 1.005 - 1.030   pH 6.0 5.0 - 8.0   Glucose, UA NEGATIVE NEGATIVE mg/dL   Hgb urine dipstick SMALL (A) NEGATIVE   Bilirubin Urine NEGATIVE NEGATIVE   Ketones, ur 20 (A) NEGATIVE mg/dL   Protein, ur NEGATIVE NEGATIVE  mg/dL   Nitrite NEGATIVE NEGATIVE   Leukocytes,Ua NEGATIVE NEGATIVE   RBC / HPF 0-5 0 - 5 RBC/hpf   WBC, UA 0-5 0 - 5 WBC/hpf    Comment:        Reflex urine culture not performed if WBC <=10, OR if Squamous epithelial cells >5. If Squamous epithelial cells >5 suggest recollection.    Bacteria, UA RARE (A) NONE SEEN   Squamous Epithelial / HPF 0-5 0 - 5 /HPF   Mucus PRESENT    Hyaline Casts, UA PRESENT     Comment: Performed at [facility], [address] [place], [place] [address]  Iron, TIBC and Ferritin Panel     Status: Abnormal   Collection Time: 08/04/24  2:00 PM  Result Value Ref Range   Total Iron Binding Capacity 311 250 - 450 ug/dL   UIBC 772 881 - 630 ug/dL   Iron 84 27 - 860 ug/dL   Iron Saturation 27 15 - 55 %   Ferritin 530 (H) 15 - 150 ng/mL  CBC with Diff     Status: Abnormal   Collection Time: 08/04/24  2:00 PM  Result Value Ref Range   WBC 5.5 3.4 - 10.8 x10E3/uL   RBC 3.90 3.77 - 5.28 x10E6/uL   Hemoglobin 11.9 11.1 - 15.9 g/dL   Hematocrit 61.8 65.9 - 46.6 %   MCV 98 (H) 79 - 97 fL   MCH 30.5 26.6 - 33.0 pg   MCHC 31.2 (L) 31.5 - 35.7 g/dL   RDW 84.8 88.2 - 84.5 %   Platelets 321 150 - 450 x10E3/uL   Neutrophils 51 Not Estab. %   Lymphs 36 Not Estab. %   Monocytes 10 Not Estab. %   Eos 2 Not Estab. %   Basos 0 Not Estab. %   Neutrophils Absolute 2.8 1.4 - 7.0 x10E3/uL   Lymphocytes Absolute 2.0 0.7 - 3.1 x10E3/uL   Monocytes Absolute 0.6 0.1 - 0.9 x10E3/uL   EOS (ABSOLUTE) 0.1 0.0 - 0.4 x10E3/uL   Basophils Absolute 0.0 0.0 - 0.2 x10E3/uL   Immature Granulocytes 0 Not Estab. %   Immature Grans (Abs) 0.0 0.0 - 0.1 x10E3/uL  CMP14+EGFR     Status: Abnormal   Collection Time: 08/04/24  2:00 PM  Result  Value Ref Range   Glucose 103 (H) 70 - 99 mg/dL   BUN 15 8 - 27 mg/dL   Creatinine, Ser 8.88 (H) 0.57 - 1.00 mg/dL   eGFR 56 (L) >40 fO/fpw/8.26   BUN/Creatinine Ratio 14 12 - 28   Sodium 139 134 - 144 mmol/L   Potassium 4.7 3.5 - 5.2 mmol/L   Chloride 101 96 - 106 mmol/L   CO2 23 20 - 29 mmol/L   Calcium  9.6 8.7 - 10.3 mg/dL   Total Protein 7.7 6.0 - 8.5 g/dL   Albumin 4.8 3.9 - 4.9 g/dL   Globulin, Total 2.9 1.5 - 4.5 g/dL   Bilirubin Total 0.4 0.0 - 1.2 mg/dL   Alkaline Phosphatase 67 44 - 121 IU/L   AST 16 0 - 40 IU/L   ALT 25 0 - 32 IU/L  Lipid Panel w/o Chol/HDL Ratio     Status: Abnormal   Collection Time: 08/04/24  2:00 PM  Result Value Ref Range   Cholesterol, Total 202 (H) 100 - 199 mg/dL   Triglycerides 892 0 - 149 mg/dL   HDL 44 >60 mg/dL   VLDL Cholesterol Cal 19 5 - 40 mg/dL  LDL Chol Calc (NIH) 139 (H) 0 - 99 mg/dL  Hemoglobin J8r     Status: Abnormal   Collection Time: 08/04/24  2:00 PM  Result Value Ref Range   Hgb A1c MFr Bld 5.8 (H) 4.8 - 5.6 %    Comment:          Prediabetes: 5.7 - 6.4          Diabetes: >6.4          Glycemic control for adults with diabetes: <7.0    Est. average glucose Bld gHb Est-mCnc 120 mg/dL  Folate     Status: None   Collection Time: 08/04/24  2:00 PM  Result Value Ref Range   Folate >20.0 >3.0 ng/mL    Comment: A serum folate concentration of less than 3.1 ng/mL is considered to represent clinical deficiency.   Vitamin B12     Status: None   Collection Time: 08/04/24  2:00 PM  Result Value Ref Range   Vitamin B-12 469 232 - 1,245 pg/mL      Assessment & Plan:  Continue current medications as prescribed. Refilled Voltaren  gel as requested by patient. Encouraged strict diet and exercise as tolerated. Low dose chest CT ordered for lung cancer screening. Will collect routine labs at next visit and FU in 4 months or PRN. Problem List Items Addressed This Visit     Rheumatoid arthritis involving multiple sites (HCC)    Relevant Medications   diclofenac  Sodium (VOLTAREN ) 1 % GEL   Prediabetes   Relevant Orders   Hemoglobin A1c   Mixed hyperlipidemia - Primary   Relevant Orders   CBC with Diff   CMP14+EGFR   Lipid Panel w/o Chol/HDL Ratio   Other Visit Diagnoses       Screening for osteoporosis         Encounter for screening for lung cancer       Relevant Orders   CT CHEST LUNG CA SCREEN LOW DOSE W/O CM       Return in about 4 months (around 12/18/2024).   Total time spent: 20 minutes  FERNAND FREDY RAMAN, MD  08/18/2024   This document may have been prepared by [facility] Voice Recognition software and as such may include unintentional dictation errors.

## 2024-08-18 NOTE — Progress Notes (Deleted)
 Established Patient Office Visit  Subjective:  Patient ID: Lori Banks, female    DOB: Nov 03, 1961  Age: 63 y.o. MRN: [id number]  Chief Complaint  Patient presents with   Follow-up    2 week follow up    HPI  No other concerns at this time.   Past Medical History:  Diagnosis Date   COPD (chronic obstructive pulmonary disease) (HCC)    History of kidney stones    Kidney stone    Osteoporosis    Pre-diabetes    Rheumatoid arthritis (HCC)     Past Surgical History:  Procedure Laterality Date   APPENDECTOMY     CESAREAN SECTION     CHOLECYSTECTOMY     CYSTOSCOPY WITH STENT PLACEMENT Left 06/17/2022   Procedure: CYSTOSCOPY WITH STENT PLACEMENT;  Surgeon: Francisca Redell BROCKS, MD;  Location: [facility];  Service: Urology;  Laterality: Left;   CYSTOSCOPY/URETEROSCOPY/HOLMIUM LASER/STENT PLACEMENT Left 07/03/2022   Procedure: CYSTOSCOPY/URETEROSCOPY/HOLMIUM LASER/STENT EXCHANGE;  Surgeon: Francisca Redell BROCKS, MD;  Location: [facility];  Service: Urology;  Laterality: Left;   MULTIPLE TOOTH EXTRACTIONS     all teeth have been removed, patient wears dentures    ORIF ANKLE FRACTURE Right 03/09/2022   Procedure: OPEN REDUCTION INTERNAL FIXATION (ORIF) ANKLE FRACTURE;  Surgeon: Leora Lynwood SAUNDERS, MD;  Location: [facility];  Service: Orthopedics;  Laterality: Right;   TUBAL LIGATION      Social History   Socioeconomic History   Marital status: Legally Separated    Spouse name: Not on file   Number of children: Not on file   Years of education: Not on file   Highest education level: Not on file  Occupational History   Not on file  Tobacco Use   Smoking status: Former    Current packs/day: 0.00    Types: Cigarettes    Quit date: 04/2019    Years since quitting: 5.3    Passive exposure: Past   Smokeless tobacco: Never  Vaping Use   Vaping status: Never Used  Substance and Sexual Activity   Alcohol use: Yes    Comment: occassionally   Drug use: Yes    Types: Marijuana     Comment: 5 days weekly    Sexual activity: Not Currently  Other Topics Concern   Not on file  Social History Narrative   Not on file   Social Drivers of Health   Financial Resource Strain: Not on file  Food Insecurity: Not on file  Transportation Needs: Not on file  Physical Activity: Not on file  Stress: Not on file  Social Connections: Not on file  Intimate Partner Violence: Not At Risk (07/03/2024)   Received from [facility]   Humiliation, Afraid, Rape, and Kick questionnaire    Within the last year, have you been afraid of your partner or ex-partner?: No    Within the last year, have you been humiliated or emotionally abused in other ways by your partner or ex-partner?: No    Within the last year, have you been kicked, hit, slapped, or otherwise physically hurt by your partner or ex-partner?: No    Within the last year, have you been raped or forced to have any kind of sexual activity by your partner or ex-partner?: No    Family History  Problem Relation Age of Onset   Diabetes Mother    Osteoarthritis Mother    Heart disease Father    Diabetes Sister    Fibromyalgia Sister    Meniere's disease  Brother    Healthy Son    Healthy Son    Seizures Son    Healthy Daughter    Epilepsy Nephew     No Known Allergies  Outpatient Medications Prior to Visit  Medication Sig   alendronate (FOSAMAX) 70 MG tablet Take 70 mg by mouth once a week.   atorvastatin  (LIPITOR) 20 MG tablet Take 1 tablet (20 mg total) by mouth daily.   folic acid  (FOLVITE ) 1 MG tablet Take 2 mg by mouth daily.   methotrexate  (RHEUMATREX) 2.5 MG tablet Take 7.5mg  in the morning and with dinner one day per week.   Upadacitinib ER (RINVOQ) 15 MG TB24 Take 1 tablet by mouth daily.   [DISCONTINUED] diclofenac  Sodium (VOLTAREN ) 1 % GEL Apply topically 2 (two) times daily as needed (pain).   No facility-administered medications prior to visit.    ROS     Objective:   BP 112/76   Pulse 62   Ht  5' 6 (1.676 m)   Wt 193 lb 6.4 oz (87.7 kg)   SpO2 96%   BMI 31.22 kg/m   Vitals:   08/18/24 1331  BP: 112/76  Pulse: 62  Height: 5' 6 (1.676 m)  Weight: 193 lb 6.4 oz (87.7 kg)  SpO2: 96%  BMI (Calculated): 31.23    Physical Exam   No results found for any visits on 08/18/24.  Recent Results (from the past 2160 hours)  Lactic acid, plasma     Status: None   Collection Time: 06/18/24  4:26 PM  Result Value Ref Range   Lactic Acid, Venous 1.7 0.5 - 1.9 mmol/L    Comment: Performed at [facility], [address] [place], [place] [address]  Comprehensive metabolic panel     Status: Abnormal   Collection Time: 06/18/24  4:26 PM  Result Value Ref Range   Sodium 133 (L) 135 - 145 mmol/L   Potassium 3.6 3.5 - 5.1 mmol/L   Chloride 102 98 - 111 mmol/L   CO2 22 22 - 32 mmol/L   Glucose, Bld 116 (H) 70 - 99 mg/dL    Comment: Glucose reference range applies only to samples taken after fasting for at least 8 hours.   BUN 18 8 - 23 mg/dL   Creatinine, Ser 9.19 0.44 - 1.00 mg/dL   Calcium  9.1 8.9 - 10.3 mg/dL   Total Protein 6.0 (L) 6.5 - 8.1 g/dL   Albumin 3.1 (L) 3.5 - 5.0 g/dL   AST 22 15 - 41 U/L   ALT 19 0 - 44 U/L   Alkaline Phosphatase 25 (L) 38 - 126 U/L   Total Bilirubin 1.0 0.0 - 1.2 mg/dL   GFR, Estimated >39 >39 mL/min    Comment: (NOTE) Calculated using the CKD-EPI Creatinine Equation (2021)    Anion gap 9 5 - 15    Comment: Performed at [facility], [address] [place], [place] [address]  CBC with Differential     Status: Abnormal   Collection Time: 06/18/24  4:26 PM  Result Value Ref Range   WBC 8.9 4.0 - 10.5 K/uL   RBC 3.65 (L) 3.87 - 5.11 MIL/uL   Hemoglobin 11.4 (L) 12.0 - 15.0 g/dL   HCT 65.7 (L) 63.9 - 53.9 %   MCV 93.7 80.0 - 100.0 fL   MCH 31.2 26.0 - 34.0 pg   MCHC 33.3 30.0 - 36.0 g/dL   RDW 85.0 88.4 - 84.4 %   Platelets 207 150 - 400  K/uL   nRBC 0.0 0.0 - 0.2 %   Neutrophils Relative % 83 %   Neutro Abs 7.4 1.7  - 7.7 K/uL   Lymphocytes Relative 8 %   Lymphs Abs 0.7 0.7 - 4.0 K/uL   Monocytes Relative 9 %   Monocytes Absolute 0.8 0.1 - 1.0 K/uL   Eosinophils Relative 0 %   Eosinophils Absolute 0.0 0.0 - 0.5 K/uL   Basophils Relative 0 %   Basophils Absolute 0.0 0.0 - 0.1 K/uL   WBC Morphology MORPHOLOGY UNREMARKABLE    RBC Morphology MORPHOLOGY UNREMARKABLE    Smear Review Normal platelet morphology    Immature Granulocytes 0 %   Abs Immature Granulocytes 0.04 0.00 - 0.07 K/uL    Comment: Performed at [facility], [address] [place], [place] [address]  Brain natriuretic peptide     Status: None   Collection Time: 06/18/24  4:26 PM  Result Value Ref Range   B Natriuretic Peptide 16.3 0.0 - 100.0 pg/mL    Comment: Performed at [facility], [address] [place], [place] [address]  Troponin I (High Sensitivity)     Status: None   Collection Time: 06/18/24  4:26 PM  Result Value Ref Range   Troponin I (High Sensitivity) 6 <18 ng/L    Comment: (NOTE) Elevated high sensitivity troponin I (hsTnI) values and significant  changes across serial measurements may suggest ACS but many other  chronic and acute conditions are known to elevate hsTnI results.  Refer to the Links section for chest pain algorithms and additional  guidance. Performed at [facility], [address] [place], [place] [address]   Lactic acid, plasma     Status: None   Collection Time: 06/18/24  5:50 PM  Result Value Ref Range   Lactic Acid, Venous 1.2 0.5 - 1.9 mmol/L    Comment: Performed at [facility], [address] [place], [place] [address]  Blood Culture (routine x 2)     Status: None   Collection Time: 06/18/24  5:50 PM   Specimen: BLOOD  Result Value Ref Range   Specimen Description BLOOD BLOOD LEFT FOREARM    Special Requests      BOTTLES DRAWN AEROBIC AND ANAEROBIC Blood Culture adequate volume   Culture      NO GROWTH 5 DAYS Performed at [facility], [address] [place], [place] [address]    Report Status 06/23/2024 FINAL   Troponin I (High Sensitivity)     Status: None   Collection Time: 06/18/24  5:50 PM  Result Value Ref Range   Troponin I (High Sensitivity) 6 <18 ng/L    Comment: (NOTE) Elevated high sensitivity troponin I (hsTnI) values and significant  changes across serial measurements may suggest ACS but many other  chronic and acute conditions are known to elevate hsTnI results.  Refer to the Links section for chest pain algorithms and additional  guidance. Performed at [facility], [address] [place], [place] [address]   Blood Culture (routine x 2)     Status: None   Collection Time: 06/18/24  5:55 PM   Specimen: BLOOD RIGHT HAND  Result Value Ref Range   Specimen Description BLOOD RIGHT HAND    Special Requests      BOTTLES DRAWN AEROBIC AND ANAEROBIC Blood Culture results may not be optimal due to an inadequate volume of blood received in culture bottles   Culture      NO GROWTH  5 DAYS Performed at [facility], [address] [place], [place] [address]    Report Status 06/23/2024 FINAL   Resp panel by RT-PCR (RSV, Flu A&B, Covid) Anterior Nasal Swab     Status: None   Collection Time: 06/18/24  6:15 PM   Specimen: Anterior Nasal Swab  Result Value Ref Range   SARS Coronavirus 2 by RT PCR NEGATIVE NEGATIVE    Comment: (NOTE) SARS-CoV-2 target nucleic acids are NOT DETECTED.  The SARS-CoV-2 RNA is generally detectable in upper respiratory specimens during the acute phase of infection. The lowest concentration of SARS-CoV-2 viral copies this assay can detect is 138 copies/mL. A negative result does not preclude SARS-Cov-2 infection and should not be used as the sole basis for treatment or other patient management decisions. A negative result may occur with  improper specimen collection/handling, submission of specimen other than nasopharyngeal swab, presence of  viral mutation(s) within the areas targeted by this assay, and inadequate number of viral copies(<138 copies/mL). A negative result must be combined with clinical observations, patient history, and epidemiological information. The expected result is Negative.  Fact Sheet for Patients:  BloggerCourse.com  Fact Sheet for Healthcare Providers:  SeriousBroker.it  This test is no t yet approved or cleared by the [place]  FDA and  has been authorized for detection and/or diagnosis of SARS-CoV-2 by FDA under an Emergency Use Authorization (EUA). This EUA will remain  in effect (meaning this test can be used) for the duration of the COVID-19 declaration under Section 564(b)(1) of the Act, 21 U.S.C.section 360bbb-3(b)(1), unless the authorization is terminated  or revoked sooner.       Influenza A by PCR NEGATIVE NEGATIVE   Influenza B by PCR NEGATIVE NEGATIVE    Comment: (NOTE) The Xpert Xpress SARS-CoV-2/FLU/RSV plus assay is intended as an aid in the diagnosis of influenza from Nasopharyngeal swab specimens and should not be used as a sole basis for treatment. Nasal washings and aspirates are unacceptable for Xpert Xpress SARS-CoV-2/FLU/RSV testing.  Fact Sheet for Patients: BloggerCourse.com  Fact Sheet for Healthcare Providers: SeriousBroker.it  This test is not yet approved or cleared by the [place]  FDA and has been authorized for detection and/or diagnosis of SARS-CoV-2 by FDA under an Emergency Use Authorization (EUA). This EUA will remain in effect (meaning this test can be used) for the duration of the COVID-19 declaration under Section 564(b)(1) of the Act, 21 U.S.C. section 360bbb-3(b)(1), unless the authorization is terminated or revoked.     Resp Syncytial Virus by PCR NEGATIVE NEGATIVE    Comment: (NOTE) Fact Sheet for  Patients: BloggerCourse.com  Fact Sheet for Healthcare Providers: SeriousBroker.it  This test is not yet approved or cleared by the [place]  FDA and has been authorized for detection and/or diagnosis of SARS-CoV-2 by FDA under an Emergency Use Authorization (EUA). This EUA will remain in effect (meaning this test can be used) for the duration of the COVID-19 declaration under Section 564(b)(1) of the Act, 21 U.S.C. section 360bbb-3(b)(1), unless the authorization is terminated or revoked.  Performed at [facility], [address] [place], [place] [address]   Urinalysis, w/ Reflex to Culture (Infection Suspected) -Urine, Clean Catch     Status: Abnormal   Collection Time: 06/18/24  6:15 PM  Result Value Ref Range   Specimen Source URINE, CLEAN CATCH    Color, Urine YELLOW (A) YELLOW   APPearance CLEAR (A) CLEAR   Specific Gravity, Urine 1.012 1.005 - 1.030   pH  6.0 5.0 - 8.0   Glucose, UA NEGATIVE NEGATIVE mg/dL   Hgb urine dipstick SMALL (A) NEGATIVE   Bilirubin Urine NEGATIVE NEGATIVE   Ketones, ur 20 (A) NEGATIVE mg/dL   Protein, ur NEGATIVE NEGATIVE mg/dL   Nitrite NEGATIVE NEGATIVE   Leukocytes,Ua NEGATIVE NEGATIVE   RBC / HPF 0-5 0 - 5 RBC/hpf   WBC, UA 0-5 0 - 5 WBC/hpf    Comment:        Reflex urine culture not performed if WBC <=10, OR if Squamous epithelial cells >5. If Squamous epithelial cells >5 suggest recollection.    Bacteria, UA RARE (A) NONE SEEN   Squamous Epithelial / HPF 0-5 0 - 5 /HPF   Mucus PRESENT    Hyaline Casts, UA PRESENT     Comment: Performed at [facility], [address] [place], [place] [address]  Iron, TIBC and Ferritin Panel     Status: Abnormal   Collection Time: 08/04/24  2:00 PM  Result Value Ref Range   Total Iron Binding Capacity 311 250 - 450 ug/dL   UIBC 772 881 - 630 ug/dL   Iron 84 27 - 860 ug/dL   Iron Saturation 27 15 - 55 %   Ferritin 530  (H) 15 - 150 ng/mL  CBC with Diff     Status: Abnormal   Collection Time: 08/04/24  2:00 PM  Result Value Ref Range   WBC 5.5 3.4 - 10.8 x10E3/uL   RBC 3.90 3.77 - 5.28 x10E6/uL   Hemoglobin 11.9 11.1 - 15.9 g/dL   Hematocrit 61.8 65.9 - 46.6 %   MCV 98 (H) 79 - 97 fL   MCH 30.5 26.6 - 33.0 pg   MCHC 31.2 (L) 31.5 - 35.7 g/dL   RDW 84.8 88.2 - 84.5 %   Platelets 321 150 - 450 x10E3/uL   Neutrophils 51 Not Estab. %   Lymphs 36 Not Estab. %   Monocytes 10 Not Estab. %   Eos 2 Not Estab. %   Basos 0 Not Estab. %   Neutrophils Absolute 2.8 1.4 - 7.0 x10E3/uL   Lymphocytes Absolute 2.0 0.7 - 3.1 x10E3/uL   Monocytes Absolute 0.6 0.1 - 0.9 x10E3/uL   EOS (ABSOLUTE) 0.1 0.0 - 0.4 x10E3/uL   Basophils Absolute 0.0 0.0 - 0.2 x10E3/uL   Immature Granulocytes 0 Not Estab. %   Immature Grans (Abs) 0.0 0.0 - 0.1 x10E3/uL  CMP14+EGFR     Status: Abnormal   Collection Time: 08/04/24  2:00 PM  Result Value Ref Range   Glucose 103 (H) 70 - 99 mg/dL   BUN 15 8 - 27 mg/dL   Creatinine, Ser 8.88 (H) 0.57 - 1.00 mg/dL   eGFR 56 (L) >40 fO/fpw/8.26   BUN/Creatinine Ratio 14 12 - 28   Sodium 139 134 - 144 mmol/L   Potassium 4.7 3.5 - 5.2 mmol/L   Chloride 101 96 - 106 mmol/L   CO2 23 20 - 29 mmol/L   Calcium  9.6 8.7 - 10.3 mg/dL   Total Protein 7.7 6.0 - 8.5 g/dL   Albumin 4.8 3.9 - 4.9 g/dL   Globulin, Total 2.9 1.5 - 4.5 g/dL   Bilirubin Total 0.4 0.0 - 1.2 mg/dL   Alkaline Phosphatase 67 44 - 121 IU/L   AST 16 0 - 40 IU/L   ALT 25 0 - 32 IU/L  Lipid Panel w/o Chol/HDL Ratio     Status: Abnormal   Collection Time: 08/04/24  2:00 PM  Result  Value Ref Range   Cholesterol, Total 202 (H) 100 - 199 mg/dL   Triglycerides 892 0 - 149 mg/dL   HDL 44 >60 mg/dL   VLDL Cholesterol Cal 19 5 - 40 mg/dL   LDL Chol Calc (NIH) 860 (H) 0 - 99 mg/dL  Hemoglobin J8r     Status: Abnormal   Collection Time: 08/04/24  2:00 PM  Result Value Ref Range   Hgb A1c MFr Bld 5.8 (H) 4.8 - 5.6 %    Comment:           Prediabetes: 5.7 - 6.4          Diabetes: >6.4          Glycemic control for adults with diabetes: <7.0    Est. average glucose Bld gHb Est-mCnc 120 mg/dL  Folate     Status: None   Collection Time: 08/04/24  2:00 PM  Result Value Ref Range   Folate >20.0 >3.0 ng/mL    Comment: A serum folate concentration of less than 3.1 ng/mL is considered to represent clinical deficiency.   Vitamin B12     Status: None   Collection Time: 08/04/24  2:00 PM  Result Value Ref Range   Vitamin B-12 469 232 - 1,245 pg/mL      Assessment & Plan:   Problem List Items Addressed This Visit     Rheumatoid arthritis involving multiple sites (HCC)   Relevant Medications   diclofenac  Sodium (VOLTAREN ) 1 % GEL   Prediabetes   Relevant Orders   Hemoglobin A1c   Mixed hyperlipidemia - Primary   Relevant Orders   CBC with Diff   CMP14+EGFR   Lipid Panel w/o Chol/HDL Ratio   Other Visit Diagnoses       Screening for osteoporosis         Encounter for screening for lung cancer       Relevant Orders   CT CHEST LUNG CA SCREEN LOW DOSE W/O CM       Return in about 4 months (around 12/18/2024).   Total time spent: 25 minutes  FERNAND FREDY RAMAN, MD  08/18/2024   This document may have been prepared by [facility] Voice Recognition software and as such may include unintentional dictation errors.

## 2024-08-24 LAB — COLOGUARD: COLOGUARD: NEGATIVE

## 2024-09-29 ENCOUNTER — Encounter: Payer: Self-pay | Admitting: Internal Medicine

## 2024-09-29 ENCOUNTER — Ambulatory Visit (INDEPENDENT_AMBULATORY_CARE_PROVIDER_SITE_OTHER): Payer: Medicare (Managed Care) | Admitting: Internal Medicine

## 2024-09-29 VITALS — BP 122/84 | HR 66 | Ht 66.0 in | Wt 198.2 lb

## 2024-09-29 DIAGNOSIS — Z0130 Encounter for examination of blood pressure without abnormal findings: Secondary | ICD-10-CM

## 2024-09-29 DIAGNOSIS — N644 Mastodynia: Secondary | ICD-10-CM | POA: Diagnosis not present

## 2024-09-29 DIAGNOSIS — Z131 Encounter for screening for diabetes mellitus: Secondary | ICD-10-CM

## 2024-09-29 DIAGNOSIS — N6323 Unspecified lump in the left breast, lower outer quadrant: Secondary | ICD-10-CM | POA: Insufficient documentation

## 2024-09-29 NOTE — Progress Notes (Signed)
 Established Patient Office Visit  Subjective:  Patient ID: Lori Banks, female    DOB: 04-26-1961  Age: 63 y.o. MRN: [id number]  Chief Complaint  Patient presents with   Breast Pain    Left    Patient is here today for acute visit. She reports she noticed left breast pain approximately 1 week ago that initially got worse but has started to improve in the last 2 days. She reports swelling to lateral side of left breast, itching to nipple area,  and a painful lump. Denies nipple discharge or skin changes. She was unsure if she saw a shadow or bruise since her symptoms arose. She is due for mammogram 10/2023. Reports lump as painful but did not take anything for the pain or inflammation. Will order dx mammogram and left breast US  at this time. Encouraged patient to take NSAID to relieve pain and help reduce inflammation daily until symptoms resolved. Discussed concerns patient has at this time in detail. Patient reports 2 years ago she had dx mammogram for painful lump with bloody nipple discharge that came back benign.   Patient denies chest pain, palpitations, shortness of breath.  DEXA scan and lung cancer screening scheduled for later this month.     No other concerns at this time.   Past Medical History:  Diagnosis Date   COPD (chronic obstructive pulmonary disease) (HCC)    History of kidney stones    Kidney stone    Osteoporosis    Pre-diabetes    Rheumatoid arthritis (HCC)     Past Surgical History:  Procedure Laterality Date   APPENDECTOMY     CESAREAN SECTION     CHOLECYSTECTOMY     CYSTOSCOPY WITH STENT PLACEMENT Left 06/17/2022   Procedure: CYSTOSCOPY WITH STENT PLACEMENT;  Surgeon: Francisca Redell BROCKS, MD;  Location: [facility];  Service: Urology;  Laterality: Left;   CYSTOSCOPY/URETEROSCOPY/HOLMIUM LASER/STENT PLACEMENT Left 07/03/2022   Procedure: CYSTOSCOPY/URETEROSCOPY/HOLMIUM LASER/STENT EXCHANGE;  Surgeon: Francisca Redell BROCKS, MD;  Location: [facility];   Service: Urology;  Laterality: Left;   MULTIPLE TOOTH EXTRACTIONS     all teeth have been removed, patient wears dentures    ORIF ANKLE FRACTURE Right 03/09/2022   Procedure: OPEN REDUCTION INTERNAL FIXATION (ORIF) ANKLE FRACTURE;  Surgeon: Leora Lynwood SAUNDERS, MD;  Location: [facility];  Service: Orthopedics;  Laterality: Right;   TUBAL LIGATION      Social History   Socioeconomic History   Marital status: Legally Separated    Spouse name: Not on file   Number of children: Not on file   Years of education: Not on file   Highest education level: Not on file  Occupational History   Not on file  Tobacco Use   Smoking status: Former    Current packs/day: 0.00    Types: Cigarettes    Quit date: 04/2019    Years since quitting: 5.4    Passive exposure: Past   Smokeless tobacco: Never  Vaping Use   Vaping status: Never Used  Substance and Sexual Activity   Alcohol use: Yes    Comment: occassionally   Drug use: Yes    Types: Marijuana    Comment: 5 days weekly    Sexual activity: Not Currently  Other Topics Concern   Not on file  Social History Narrative   Not on file   Social Drivers of Health   Financial Resource Strain: Not on file  Food Insecurity: Not on file  Transportation Needs: Not on file  Physical  Activity: Not on file  Stress: Not on file  Social Connections: Not on file  Intimate Partner Violence: Not At Risk (07/03/2024)   Received from [facility]   Humiliation, Afraid, Rape, and Kick questionnaire    Within the last year, have you been afraid of your partner or ex-partner?: No    Within the last year, have you been humiliated or emotionally abused in other ways by your partner or ex-partner?: No    Within the last year, have you been kicked, hit, slapped, or otherwise physically hurt by your partner or ex-partner?: No    Within the last year, have you been raped or forced to have any kind of sexual activity by your partner or ex-partner?: No    Family  History  Problem Relation Age of Onset   Diabetes Mother    Osteoarthritis Mother    Heart disease Father    Diabetes Sister    Fibromyalgia Sister    Meniere's disease Brother    Healthy Son    Healthy Son    Seizures Son    Healthy Daughter    Epilepsy Nephew     No Known Allergies  Outpatient Medications Prior to Visit  Medication Sig   alendronate (FOSAMAX) 70 MG tablet Take 70 mg by mouth once a week.   atorvastatin  (LIPITOR) 20 MG tablet Take 1 tablet (20 mg total) by mouth daily.   Calcium  Carbonate-Vitamin D  (CALTRATE 600+D PO) Take by mouth.   folic acid  (FOLVITE ) 1 MG tablet Take 2 mg by mouth daily.   methotrexate  (RHEUMATREX) 2.5 MG tablet Take 7.5mg  in the morning and with dinner one day per week.   Upadacitinib ER (RINVOQ) 15 MG TB24 Take 1 tablet by mouth daily.   diclofenac  Sodium (VOLTAREN ) 1 % GEL Apply 2 g topically 2 (two) times daily as needed (pain). (Patient not taking: Reported on 09/29/2024)   No facility-administered medications prior to visit.    Review of Systems  Reason unable to perform ROS: left breast pain.  Constitutional: Negative.  Negative for chills, fever and malaise/fatigue.  HENT: Negative.  Negative for congestion and sore throat.   Eyes: Negative.  Negative for blurred vision and pain.  Respiratory: Negative.  Negative for cough and shortness of breath.   Cardiovascular: Negative.  Negative for chest pain, palpitations and leg swelling.  Gastrointestinal: Negative.  Negative for abdominal pain, blood in stool, constipation, diarrhea, heartburn, melena, nausea and vomiting.  Genitourinary: Negative.  Negative for dysuria, flank pain, frequency and urgency.  Musculoskeletal: Negative.  Negative for joint pain and myalgias.  Skin: Negative.   Neurological: Negative.  Negative for dizziness, tingling, sensory change, weakness and headaches.  Endo/Heme/Allergies: Negative.   Psychiatric/Behavioral: Negative.  Negative for depression and  suicidal ideas. The patient is not nervous/anxious.        Objective:   BP 122/84   Pulse 66   Ht 5' 6 (1.676 m)   Wt 198 lb 3.2 oz (89.9 kg)   SpO2 97%   BMI 31.99 kg/m   Vitals:   09/29/24 1426  BP: 122/84  Pulse: 66  Height: 5' 6 (1.676 m)  Weight: 198 lb 3.2 oz (89.9 kg)  SpO2: 97%  BMI (Calculated): 32.01    Physical Exam Vitals and nursing note reviewed. Exam conducted with a chaperone present.  Constitutional:      Appearance: Normal appearance.  HENT:     Head: Normocephalic and atraumatic.     Nose: Nose normal.  Mouth/Throat:     Mouth: Mucous membranes are moist.     Pharynx: Oropharynx is clear.  Eyes:     Conjunctiva/sclera: Conjunctivae normal.     Pupils: Pupils are equal, round, and reactive to light.  Cardiovascular:     Rate and Rhythm: Normal rate and regular rhythm.     Pulses: Normal pulses.     Heart sounds: Normal heart sounds. No murmur heard. Pulmonary:     Effort: Pulmonary effort is normal.     Breath sounds: Normal breath sounds. No wheezing.  Chest:  Breasts:    Right: Normal.     Left: Swelling, mass and tenderness present.    Abdominal:     General: Bowel sounds are normal.     Palpations: Abdomen is soft.     Tenderness: There is no abdominal tenderness. There is no right CVA tenderness or left CVA tenderness.  Musculoskeletal:        General: Normal range of motion.     Cervical back: Normal range of motion.     Right lower leg: No edema.     Left lower leg: No edema.  Lymphadenopathy:     Upper Body:     Right upper body: No axillary adenopathy.     Left upper body: No axillary adenopathy.  Skin:    General: Skin is warm and dry.  Neurological:     General: No focal deficit present.     Mental Status: She is alert and oriented to person, place, and time.  Psychiatric:        Mood and Affect: Mood normal.        Behavior: Behavior normal.      No results found for any visits on 09/29/24.  Recent  Results (from the past 2160 hours)  Iron, TIBC and Ferritin Panel     Status: Abnormal   Collection Time: 08/04/24  2:00 PM  Result Value Ref Range   Total Iron Binding Capacity 311 250 - 450 ug/dL   UIBC 772 881 - 630 ug/dL   Iron 84 27 - 860 ug/dL   Iron Saturation 27 15 - 55 %   Ferritin 530 (H) 15 - 150 ng/mL  CBC with Diff     Status: Abnormal   Collection Time: 08/04/24  2:00 PM  Result Value Ref Range   WBC 5.5 3.4 - 10.8 x10E3/uL   RBC 3.90 3.77 - 5.28 x10E6/uL   Hemoglobin 11.9 11.1 - 15.9 g/dL   Hematocrit 61.8 65.9 - 46.6 %   MCV 98 (H) 79 - 97 fL   MCH 30.5 26.6 - 33.0 pg   MCHC 31.2 (L) 31.5 - 35.7 g/dL   RDW 84.8 88.2 - 84.5 %   Platelets 321 150 - 450 x10E3/uL   Neutrophils 51 Not Estab. %   Lymphs 36 Not Estab. %   Monocytes 10 Not Estab. %   Eos 2 Not Estab. %   Basos 0 Not Estab. %   Neutrophils Absolute 2.8 1.4 - 7.0 x10E3/uL   Lymphocytes Absolute 2.0 0.7 - 3.1 x10E3/uL   Monocytes Absolute 0.6 0.1 - 0.9 x10E3/uL   EOS (ABSOLUTE) 0.1 0.0 - 0.4 x10E3/uL   Basophils Absolute 0.0 0.0 - 0.2 x10E3/uL   Immature Granulocytes 0 Not Estab. %   Immature Grans (Abs) 0.0 0.0 - 0.1 x10E3/uL  CMP14+EGFR     Status: Abnormal   Collection Time: 08/04/24  2:00 PM  Result Value Ref Range   Glucose 103 (H) 70 -  99 mg/dL   BUN 15 8 - 27 mg/dL   Creatinine, Ser 8.88 (H) 0.57 - 1.00 mg/dL   eGFR 56 (L) >40 fO/fpw/8.26   BUN/Creatinine Ratio 14 12 - 28   Sodium 139 134 - 144 mmol/L   Potassium 4.7 3.5 - 5.2 mmol/L   Chloride 101 96 - 106 mmol/L   CO2 23 20 - 29 mmol/L   Calcium  9.6 8.7 - 10.3 mg/dL   Total Protein 7.7 6.0 - 8.5 g/dL   Albumin 4.8 3.9 - 4.9 g/dL   Globulin, Total 2.9 1.5 - 4.5 g/dL   Bilirubin Total 0.4 0.0 - 1.2 mg/dL   Alkaline Phosphatase 67 44 - 121 IU/L   AST 16 0 - 40 IU/L   ALT 25 0 - 32 IU/L  Lipid Panel w/o Chol/HDL Ratio     Status: Abnormal   Collection Time: 08/04/24  2:00 PM  Result Value Ref Range   Cholesterol, Total 202 (H) 100 -  199 mg/dL   Triglycerides 892 0 - 149 mg/dL   HDL 44 >60 mg/dL   VLDL Cholesterol Cal 19 5 - 40 mg/dL   LDL Chol Calc (NIH) 860 (H) 0 - 99 mg/dL  Hemoglobin J8r     Status: Abnormal   Collection Time: 08/04/24  2:00 PM  Result Value Ref Range   Hgb A1c MFr Bld 5.8 (H) 4.8 - 5.6 %    Comment:          Prediabetes: 5.7 - 6.4          Diabetes: >6.4          Glycemic control for adults with diabetes: <7.0    Est. average glucose Bld gHb Est-mCnc 120 mg/dL  Folate     Status: None   Collection Time: 08/04/24  2:00 PM  Result Value Ref Range   Folate >20.0 >3.0 ng/mL    Comment: A serum folate concentration of less than 3.1 ng/mL is considered to represent clinical deficiency.   Vitamin B12     Status: None   Collection Time: 08/04/24  2:00 PM  Result Value Ref Range   Vitamin B-12 469 232 - 1,245 pg/mL  Cologuard     Status: None   Collection Time: 08/16/24 10:05 AM  Result Value Ref Range   COLOGUARD Negative Negative    Comment: The Cologuard (TM) test was performed on this specimen.  NEGATIVE TEST RESULT. A negative Cologuard result indicates a low likelihood that a colorectal cancer (CRC) or advanced adenoma (adenomatous polyps with more advanced pre-malignant features) is present. The chance that a person with a negative Cologuard test has a colorectal cancer is less than 1 in 1500 (negative predictive value >99.9%) or has an advanced adenoma is less than 5.3% (negative predictive value 94.7%). These data are based on a prospective cross-sectional study of 10,000 individuals at average risk for colorectal cancer who were screened with both Cologuard and colonoscopy. (Imperiale T. et al, N Engl J Med 2014;370(14):1286-1297) The normal value (reference range) for this assay is negative.  COLOGUARD RE-SCREENING RECOMMENDATION: Periodic colorectal cancer screening is an important part of preventive healthcare for asymptomatic individuals at average risk for colorectal cancer. Following  a negative Cologuard  result, the American Cancer Society and U.S. Multi-Society Task Force screening guidelines recommend a Cologuard re-screening interval of 3 years.  References: American Cancer Society Guideline for Colorectal Cancer Screening: https://www.cancer.org/cancer/colon-rectal-cancer/detection-diagnosis-staging/acs-recommendations.html.; Rex DK, Boland CR, Dominitz JK, Colorectal Cancer Screening: Recommendations for Physicians and Patients from the  U.S. Multi-Society Task Force on Colorectal Cancer Screening , Am J Gastroenterology 2017; [id number].  TEST DESCRIPTION: Composite algorithmic analysis of stool DNA-biomarkers with hemoglobin immunoassay.   Quantitative values of individual biomarkers are not reportable and are not associated with individual biomarker result reference ranges. Cologuard is intended for colorectal cancer screening of adults of either sex, 45 years or older, who are at average-risk for colorectal cancer (CRC). Cologuard has been approved for use by the [place]  FDA. The performance of Cologuard was established in a cross sectional study of average-risk adults aged 80-84. Cologuard performance in patients ages 54 to 72 years was estimated by sub-group analysis of near-age groups. Colonoscopies performed for a positive result may find as the most clinically significant lesion: colorectal cancer [4.0%], advanced adenoma (including sessile serrated polyps greater than or equal to 1cm diameter) [20%] or non- advanced adenoma [31%]; or no colorectal neoplasia [45%]. These estimates are derived from a prospective cross-sectional screening study of 10,000 individuals at average risk for colorectal cancer who were screened with both Cologuard and colonoscopy. (Imperiale T. et al, LOISE [place] J Med 2014;370(14):1286-1297.) Cologuard may produce a false negative or false positive result (no colorectal cancer or precancerous polyp present at colonoscopy follow up). A negative Cologuard  test result does not guarantee the absence of CRC or advanced adenoma  (pre-cancer). The current Cologuard screening interval is every 3 years. Science writer and [place] Therapist, music). Cologuard performance data in a 10,000 patient pivotal study using colonoscopy as the reference method can be accessed at the following location: www.exactlabs.com/results. Additional description of the Cologuard test process, warnings and precautions can be found at www.cologuard.com.       Assessment & Plan:  Start NSAID daily to reduce inflammation and pain. DX mammogram and left breast US  ordered. Problem List Items Addressed This Visit     Breast lump on left side at 5 o'clock position - Primary   Relevant Orders   MM 3D DIAGNOSTIC MAMMOGRAM BILATERAL BREAST   US  LIMITED ULTRASOUND INCLUDING AXILLA LEFT BREAST     Follow up as scheduled.   Total time spent: 25 minutes.  This time includes review of previous notes and results and patient face to face interaction during today's visit.    FERNAND FREDY RAMAN, MD  09/29/2024   This document may have been prepared by [facility] Voice Recognition software and as such may include unintentional dictation errors.

## 2024-10-05 ENCOUNTER — Inpatient Hospital Stay
Admission: RE | Admit: 2024-10-05 | Discharge: 2024-10-05 | Disposition: A | Payer: Self-pay | Source: Ambulatory Visit | Attending: Internal Medicine | Admitting: Internal Medicine

## 2024-10-05 ENCOUNTER — Inpatient Hospital Stay
Admission: RE | Admit: 2024-10-05 | Discharge: 2024-10-05 | Disposition: A | Discharge status: Home / Self Care | Payer: Self-pay | Source: Ambulatory Visit | Attending: Internal Medicine | Admitting: Internal Medicine

## 2024-10-05 ENCOUNTER — Ambulatory Visit: Payer: Medicare (Managed Care)

## 2024-10-05 ENCOUNTER — Ambulatory Visit
Admission: RE | Admit: 2024-10-05 | Discharge: 2024-10-05 | Disposition: A | Discharge status: Home / Self Care | Payer: Medicare (Managed Care) | Source: Ambulatory Visit | Attending: Internal Medicine | Admitting: Internal Medicine

## 2024-10-05 ENCOUNTER — Other Ambulatory Visit: Payer: Self-pay | Admitting: *Deleted

## 2024-10-05 DIAGNOSIS — Z1231 Encounter for screening mammogram for malignant neoplasm of breast: Secondary | ICD-10-CM

## 2024-10-05 DIAGNOSIS — J439 Emphysema, unspecified: Secondary | ICD-10-CM | POA: Insufficient documentation

## 2024-10-05 DIAGNOSIS — Z122 Encounter for screening for malignant neoplasm of respiratory organs: Secondary | ICD-10-CM | POA: Diagnosis present

## 2024-10-05 DIAGNOSIS — Z87891 Personal history of nicotine dependence: Secondary | ICD-10-CM | POA: Diagnosis not present

## 2024-10-09 ENCOUNTER — Ambulatory Visit: Payer: Self-pay | Admitting: Internal Medicine

## 2024-10-10 NOTE — Progress Notes (Signed)
 Patient notified

## 2024-10-12 ENCOUNTER — Ambulatory Visit
Admission: RE | Admit: 2024-10-12 | Discharge: 2024-10-12 | Disposition: A | Discharge status: Home / Self Care | Payer: Medicare (Managed Care) | Source: Ambulatory Visit | Attending: Internal Medicine | Admitting: Internal Medicine

## 2024-10-12 DIAGNOSIS — N6323 Unspecified lump in the left breast, lower outer quadrant: Secondary | ICD-10-CM | POA: Insufficient documentation

## 2024-10-30 ENCOUNTER — Encounter: Payer: Medicare (Managed Care) | Admitting: Internal Medicine

## 2024-10-30 NOTE — Progress Notes (Deleted)
 Office Visit Note  Patient: Lori Banks             Date of Birth: 1961/02/28           MRN: [id number]             PCP: Fernand Fredy RAMAN, MD Referring: Weyman Bright, MD Visit Date: 10/30/2024 Occupation: Data Unavailable  Subjective:  No chief complaint on file.   History of Present Illness: Lori Banks is a 63 y.o. female ***     Activities of Daily Living:  Patient reports morning stiffness for *** {minute/hour:19697}.   Patient {ACTIONS;DENIES/REPORTS:21021675::Denies} nocturnal pain.  Difficulty dressing/grooming: {ACTIONS;DENIES/REPORTS:21021675::Denies} Difficulty climbing stairs: {ACTIONS;DENIES/REPORTS:21021675::Denies} Difficulty getting out of chair: {ACTIONS;DENIES/REPORTS:21021675::Denies} Difficulty using hands for taps, buttons, cutlery, and/or writing: {ACTIONS;DENIES/REPORTS:21021675::Denies}  No Rheumatology ROS completed.   PMFS History:  Patient Active Problem List   Diagnosis Date Noted   Breast lump on left side at 5 o'clock position 09/29/2024   Mixed hyperlipidemia 08/04/2024   Gastroesophageal reflux disease without esophagitis 08/04/2024   Absolute anemia 08/04/2024   Cold sore 07/13/2024   Acute cholecystitis 06/17/2022   Acute unilateral obstructive uropathy 06/17/2022   Chronic obstructive pulmonary disease (COPD) (HCC) 06/17/2022   Dyslipidemia 06/17/2022   Rheumatoid arthritis involving multiple sites (HCC) 12/24/2020   Prediabetes 12/24/2020    Past Medical History:  Diagnosis Date   COPD (chronic obstructive pulmonary disease) (HCC)    History of kidney stones    Kidney stone    Osteoporosis    Pre-diabetes    Rheumatoid arthritis (HCC)     Family History  Problem Relation Age of Onset   Diabetes Mother    Osteoarthritis Mother    Heart disease Father    Diabetes Sister    Fibromyalgia Sister    Healthy Daughter    Meniere's disease Brother    Healthy Son    Healthy Son    Seizures Son     Epilepsy Nephew    Breast cancer Neg Hx    Past Surgical History:  Procedure Laterality Date   APPENDECTOMY     BREAST BIOPSY Left    2023   CESAREAN SECTION     CHOLECYSTECTOMY     CYSTOSCOPY WITH STENT PLACEMENT Left 06/17/2022   Procedure: CYSTOSCOPY WITH STENT PLACEMENT;  Surgeon: Francisca Redell BROCKS, MD;  Location: [facility];  Service: Urology;  Laterality: Left;   CYSTOSCOPY/URETEROSCOPY/HOLMIUM LASER/STENT PLACEMENT Left 07/03/2022   Procedure: CYSTOSCOPY/URETEROSCOPY/HOLMIUM LASER/STENT EXCHANGE;  Surgeon: Francisca Redell BROCKS, MD;  Location: [facility];  Service: Urology;  Laterality: Left;   MULTIPLE TOOTH EXTRACTIONS     all teeth have been removed, patient wears dentures    ORIF ANKLE FRACTURE Right 03/09/2022   Procedure: OPEN REDUCTION INTERNAL FIXATION (ORIF) ANKLE FRACTURE;  Surgeon: Leora Lynwood SAUNDERS, MD;  Location: [facility];  Service: Orthopedics;  Laterality: Right;   TUBAL LIGATION     Social History   Tobacco Use   Smoking status: Former    Current packs/day: 0.00    Types: Cigarettes    Quit date: 04/2019    Years since quitting: 5.5    Passive exposure: Past   Smokeless tobacco: Never  Vaping Use   Vaping status: Never Used  Substance Use Topics   Alcohol use: Yes    Comment: occassionally   Drug use: Yes    Types: Marijuana    Comment: 5 days weekly    Social History   Social History Narrative   Not on file  Immunization History  Administered Date(s) Administered   Influenza,inj,Quad PF,6+ Mos 12/24/2020   Moderna Sars-Covid-2 Vaccination 03/20/2020, 04/18/2020, 07/05/2020     Objective: Vital Signs: There were no vitals taken for this visit.   Physical Exam   Musculoskeletal Exam: ***  CDAI Exam: CDAI Score: -- Patient Global: --; Provider Global: -- Swollen: --; Tender: -- Joint Exam 10/30/2024   No joint exam has been documented for this visit   There is currently no information documented on the homunculus. Go to the Rheumatology  activity and complete the homunculus joint exam.  Investigation: No additional findings.  Imaging: MM 3D DIAGNOSTIC MAMMOGRAM BILATERAL BREAST Result Date: 10/12/2024 CLINICAL DATA:  Reported LATERAL LEFT breast swelling, mass, tenderness and erythema seen by clinician. Now nearly resolved without antibiotics (NSAIDs recommended by clinician). Patient unable to identify the exact location but can identify the general area. Due for annual. EXAM: DIGITAL DIAGNOSTIC BILATERAL MAMMOGRAM WITH TOMOSYNTHESIS AND CAD; ULTRASOUND LEFT BREAST LIMITED TECHNIQUE: Bilateral digital diagnostic mammography and breast tomosynthesis was performed. The images were evaluated with computer-aided detection. ; Targeted ultrasound examination of the left breast was performed. COMPARISON:  Previous exam(s). ACR Breast Density Category b: There are scattered areas of fibroglandular density. FINDINGS: Spot compression tomosynthesis views were obtained over the general area of now improved focal pain in the LEFT breast. No suspicious mammographic finding is identified in this area. No suspicious mass, microcalcification, or other finding is identified in EITHER breast. Targeted LEFT breast ultrasound was performed in the area of pain at the entire LATERAL and lower outer LEFT breast from 1-6 o'clock. No suspicious solid or cystic mass is identified. Representative negative pictures were taken. IMPRESSION: No mammographic or sonographic etiology for now improved focal LEFTbreast painidentified. There is no mammographic evidence of malignancy in EITHER breast. Breast pain is a common condition, which will often resolve on its own without intervention. It can be affected by hormonal changes, medication side effect, weight changes and fit of the bra. Pain may also be referred from other adjacent areas of the body. Breast pain may be improved by wearing adequate well-fitting support, over-the-counter topical and oral NSAID medication,  low-fat diet, and ice/heat as needed. Studies have shown an improvement in cyclic pain with use of evening primrose oil, vitamin D  and vitamin E. Recommend discussion with your doctor or other provider before starting any over the counter supplements. Clinical follow-up recommended to discuss any further work-up recommendations and appropriate treatment, which should be based on the clinical assessment. Findings and recommendations were discussed with the patient in person. RECOMMENDATION: Clinical follow-up of LEFT breast pain. Patient may return to routine screening mammogram in 1 year.(Code:[id number]) I have discussed the findings and recommendations with the patient. If applicable, a reminder letter will be sent to the patient regarding the next appointment. BI-RADS CATEGORY  1: Negative. Electronically Signed   By: Norleen Croak M.D.   On: 10/12/2024 14:57   US  LIMITED ULTRASOUND INCLUDING AXILLA LEFT BREAST  Result Date: 10/12/2024 CLINICAL DATA:  Reported LATERAL LEFT breast swelling, mass, tenderness and erythema seen by clinician. Now nearly resolved without antibiotics (NSAIDs recommended by clinician). Patient unable to identify the exact location but can identify the general area. Due for annual. EXAM: DIGITAL DIAGNOSTIC BILATERAL MAMMOGRAM WITH TOMOSYNTHESIS AND CAD; ULTRASOUND LEFT BREAST LIMITED TECHNIQUE: Bilateral digital diagnostic mammography and breast tomosynthesis was performed. The images were evaluated with computer-aided detection. ; Targeted ultrasound examination of the left breast was performed. COMPARISON:  Previous exam(s). ACR Breast Density  Category b: There are scattered areas of fibroglandular density. FINDINGS: Spot compression tomosynthesis views were obtained over the general area of now improved focal pain in the LEFT breast. No suspicious mammographic finding is identified in this area. No suspicious mass, microcalcification, or other finding is identified in EITHER breast.  Targeted LEFT breast ultrasound was performed in the area of pain at the entire LATERAL and lower outer LEFT breast from 1-6 o'clock. No suspicious solid or cystic mass is identified. Representative negative pictures were taken. IMPRESSION: No mammographic or sonographic etiology for now improved focal LEFTbreast painidentified. There is no mammographic evidence of malignancy in EITHER breast. Breast pain is a common condition, which will often resolve on its own without intervention. It can be affected by hormonal changes, medication side effect, weight changes and fit of the bra. Pain may also be referred from other adjacent areas of the body. Breast pain may be improved by wearing adequate well-fitting support, over-the-counter topical and oral NSAID medication, low-fat diet, and ice/heat as needed. Studies have shown an improvement in cyclic pain with use of evening primrose oil, vitamin D  and vitamin E. Recommend discussion with your doctor or other provider before starting any over the counter supplements. Clinical follow-up recommended to discuss any further work-up recommendations and appropriate treatment, which should be based on the clinical assessment. Findings and recommendations were discussed with the patient in person. RECOMMENDATION: Clinical follow-up of LEFT breast pain. Patient may return to routine screening mammogram in 1 year.(Code:[id number]) I have discussed the findings and recommendations with the patient. If applicable, a reminder letter will be sent to the patient regarding the next appointment. BI-RADS CATEGORY  1: Negative. Electronically Signed   By: Norleen Croak M.D.   On: 10/12/2024 14:57   CT CHEST LUNG CA SCREEN LOW DOSE W/O CM Result Date: 10/08/2024 CLINICAL DATA:  63 year old female former smoker with 46 pack-year smoking history, quit smoking 2020 EXAM: CT CHEST WITHOUT CONTRAST LOW-DOSE FOR LUNG CANCER SCREENING TECHNIQUE: Multidetector CT imaging of the chest was performed  following the standard protocol without IV contrast. RADIATION DOSE REDUCTION: This exam was performed according to the departmental dose-optimization program which includes automated exposure control, adjustment of the mA and/or kV according to patient size and/or use of iterative reconstruction technique. COMPARISON:  06/18/2024 chest radiograph. FINDINGS: Cardiovascular: Normal heart size. No significant pericardial effusion/thickening. Great vessels are normal in course and caliber. Mediastinum/Nodes: No significant thyroid nodules. Unremarkable esophagus. No pathologically enlarged axillary, mediastinal or hilar lymph nodes, noting limited sensitivity for the detection of hilar adenopathy on this noncontrast study. Lungs/Pleura: No pneumothorax. No pleural effusion. Moderate centrilobular emphysema with diffuse bronchial wall thickening. No acute consolidative airspace disease or lung masses. A few scattered tiny pulmonary nodules, largest 3.2 mm in the left upper lobe on image 56. Calcified 4 mm right lower lobe granuloma. Upper abdomen: No acute abnormality. Musculoskeletal: No aggressive appearing focal osseous lesions. Mild thoracic spondylosis. IMPRESSION: 1. Lung-RADS 2, benign appearance or behavior. Continue annual screening with low-dose chest CT without contrast in 12 months. 2.  Emphysema (ICD10-J43.9). Electronically Signed   By: Selinda DELENA Blue M.D.   On: 10/08/2024 12:17   MM Outside Films Mammo Result Date: 10/05/2024 This examination belongs to an outside facility and is stored here for comparison purposes only.  Contact the originating outside institution for any associated report or interpretation.  MM Outside Films Mammo Result Date: 10/05/2024 This examination belongs to an outside facility and is stored here for comparison purposes only.  Contact the originating outside institution for any associated report or interpretation.  MM Outside Films Mammo Result Date: 10/05/2024 This  examination belongs to an outside facility and is stored here for comparison purposes only.  Contact the originating outside institution for any associated report or interpretation.  MM Outside Films Mammo Result Date: 10/05/2024 This examination belongs to an outside facility and is stored here for comparison purposes only.  Contact the originating outside institution for any associated report or interpretation.  MM Outside Films Mammo Result Date: 10/05/2024 This examination belongs to an outside facility and is stored here for comparison purposes only.  Contact the originating outside institution for any associated report or interpretation.  MM Outside Films Mammo Result Date: 10/05/2024 This examination belongs to an outside facility and is stored here for comparison purposes only.  Contact the originating outside institution for any associated report or interpretation.  MM Outside Films Mammo Result Date: 10/05/2024 This examination belongs to an outside facility and is stored here for comparison purposes only.  Contact the originating outside institution for any associated report or interpretation.   Recent Labs: Lab Results  Component Value Date   WBC 5.5 08/04/2024   HGB 11.9 08/04/2024   PLT 321 08/04/2024   NA 139 08/04/2024   K 4.7 08/04/2024   CL 101 08/04/2024   CO2 23 08/04/2024   GLUCOSE 103 (H) 08/04/2024   BUN 15 08/04/2024   CREATININE 1.11 (H) 08/04/2024   BILITOT 0.4 08/04/2024   ALKPHOS 67 08/04/2024   AST 16 08/04/2024   ALT 25 08/04/2024   PROT 7.7 08/04/2024   ALBUMIN 4.8 08/04/2024   CALCIUM  9.6 08/04/2024   GFRAA 91 01/22/2021   QFTBGOLDPLUS NEGATIVE 01/22/2021    Speciality Comments: MTX+PLQ+Prednisonex 41yrs.,Remicade +MTX x 2yrs, Orenciax1 only, Xeljanz  + MTX 6tabs po qwk since2018.  Procedures:  No procedures performed Allergies: Patient has no known allergies.   Assessment / Plan:     Visit Diagnoses: No diagnosis found.  Orders: No  orders of the defined types were placed in this encounter.  No orders of the defined types were placed in this encounter.   Face-to-face time spent with patient was *** minutes. Greater than 50% of time was spent in counseling and coordination of care.  Follow-Up Instructions: No follow-ups on file.   Lonni LELON Ester, MD  Note - This record has been created using [employer].  Chart creation errors have been sought, but may not always  have been located. Such creation errors do not reflect on  the standard of medical care.

## 2024-10-31 ENCOUNTER — Ambulatory Visit
Admission: RE | Admit: 2024-10-31 | Discharge: 2024-10-31 | Disposition: A | Discharge status: Home / Self Care | Payer: Medicare (Managed Care) | Source: Ambulatory Visit | Attending: Internal Medicine | Admitting: Internal Medicine

## 2024-10-31 ENCOUNTER — Ambulatory Visit: Payer: Self-pay | Admitting: Internal Medicine

## 2024-10-31 DIAGNOSIS — Z13820 Encounter for screening for osteoporosis: Secondary | ICD-10-CM | POA: Insufficient documentation

## 2024-10-31 DIAGNOSIS — M8589 Other specified disorders of bone density and structure, multiple sites: Secondary | ICD-10-CM | POA: Diagnosis not present

## 2024-10-31 DIAGNOSIS — Z780 Asymptomatic menopausal state: Secondary | ICD-10-CM | POA: Diagnosis not present

## 2024-11-06 ENCOUNTER — Ambulatory Visit: Payer: Medicare (Managed Care) | Attending: Internal Medicine | Admitting: Internal Medicine

## 2024-11-06 ENCOUNTER — Ambulatory Visit: Payer: Medicare (Managed Care)

## 2024-11-06 ENCOUNTER — Encounter: Payer: Self-pay | Admitting: Internal Medicine

## 2024-11-06 VITALS — BP 116/70 | HR 55 | Temp 97.7°F | Resp 16 | Ht 65.5 in | Wt 209.0 lb

## 2024-11-06 DIAGNOSIS — M810 Age-related osteoporosis without current pathological fracture: Secondary | ICD-10-CM

## 2024-11-06 DIAGNOSIS — Z79899 Other long term (current) drug therapy: Secondary | ICD-10-CM | POA: Diagnosis not present

## 2024-11-06 DIAGNOSIS — M069 Rheumatoid arthritis, unspecified: Secondary | ICD-10-CM

## 2024-11-06 DIAGNOSIS — M3500 Sicca syndrome, unspecified: Secondary | ICD-10-CM | POA: Diagnosis not present

## 2024-11-06 MED ORDER — FOLIC ACID 1 MG PO TABS: 2.0000 mg | ORAL_TABLET | Freq: Every day | ORAL | 3 refills | Status: AC

## 2024-11-06 MED ORDER — ALENDRONATE SODIUM 70 MG PO TABS: 70.0000 mg | ORAL_TABLET | ORAL | 0 refills | Status: DC

## 2024-11-06 MED ORDER — METHOTREXATE SODIUM 2.5 MG PO TABS: 17.5000 mg | ORAL_TABLET | ORAL | 0 refills | Status: DC

## 2024-11-06 MED ORDER — RINVOQ 15 MG PO TB24: 15.0000 mg | ORAL_TABLET | Freq: Every day | ORAL | 0 refills | Status: AC

## 2024-11-06 NOTE — Progress Notes (Signed)
 Office Visit Note  Patient: Lori Banks             Date of Birth: 06/16/1961           MRN: [id number]             PCP: Fernand Fredy RAMAN, MD Referring: Fernand Fredy RAMAN, MD Visit Date: 11/06/2024 Occupation: Data Unavailable  Subjective:  New Patient (Initial Visit) (Re-establish rheumatology care .)   Discussed the use of AI scribe software for clinical note transcription with the patient, who gave verbal consent to proceed.  History of Present Illness   Lori Banks is a 63 year old female with rheumatoid arthritis who presents for a follow-up visit due to a change in her healthcare network that no longer includes [facility] rheumatology. She is currently on treatment with rinvoq  15 mg daily and methotrexate  17.5 mg PO weekly (three on Thursday and four on Friday) and folic acid  2 mg daily.  She discontinued sulfasalazine about a year ago due to intolerance.  Her rheumatoid arthritis primarily affects her hands and feet, with twisted toes on her right foot and a hammer toe on her left foot. She was diagnosed with RA in 2011 in [place]  with concerns for erosion on joint imaging. She has been on methotrexate  since 2007 or 2008 and previously used Remicade infusions closer to her initial diagnosis. She has a preference for oral medications due to a fear of needles, although this fear has lessened over time.  She also had a respiratory viral illness in July, which she associated with a second-hand pillow purchase. The symptoms resolved after discarding the pillow.  She has a history of osteoporosis and is diligent about taking Fosamax  weekly, although she missed a dose last week due to a bone density scan. She reports minimal physical activity and swelling in her feet, which she manages with compression socks. She has a history of ankle surgery on the right side due to a fracture in 2023, which required plates and pins. No history of blood clots or  blockages.  In terms of vaccinations, she has received the shingles vaccine and has had shingles twice. She also received pneumonia vaccinations.    DMARD Hx - Plaquenil - lack of efficacy, concerns about eye issues - Remicade - she took this for three years, and feels it was effective but stopped due to cost - Orencia - took only once but did not continue because of cost and hesitance on injections - Methotrexate  - current - Xeljanz  - current - Sulfasalazine - GI intolerance   Activities of Daily Living:  Patient reports morning stiffness for 30-60 minutes.   Patient Denies nocturnal pain.  Difficulty dressing/grooming: Denies Difficulty climbing stairs: Denies Difficulty getting out of chair: Denies Difficulty using hands for taps, buttons, cutlery, and/or writing: Reports  Review of Systems  Constitutional:  Positive for fatigue.  HENT:  Positive for mouth sores and mouth dryness.   Eyes:  Negative for dryness.  Respiratory:  Positive for shortness of breath.   Cardiovascular:  Negative for chest pain and palpitations.  Gastrointestinal:  Negative for blood in stool, constipation and diarrhea.  Endocrine: Negative for increased urination.  Genitourinary:  Positive for involuntary urination.  Musculoskeletal:  Positive for joint pain, gait problem, joint pain, joint swelling and morning stiffness. Negative for myalgias, muscle weakness, muscle tenderness and myalgias.  Skin:  Positive for sensitivity to sunlight. Negative for color change and rash.  Allergic/Immunologic: Positive for susceptible to  infections.  Neurological:  Negative for dizziness and headaches.  Hematological:  Positive for swollen glands.  Psychiatric/Behavioral:  Positive for depressed mood. Negative for sleep disturbance. The patient is not nervous/anxious.     PMFS History:  Patient Active Problem List   Diagnosis Date Noted   High risk medication use 11/06/2024   Osteoporosis 11/06/2024   Sicca  syndrome 11/06/2024   Breast lump on left side at 5 o'clock position 09/29/2024   Mixed hyperlipidemia 08/04/2024   Gastroesophageal reflux disease without esophagitis 08/04/2024   Absolute anemia 08/04/2024   Cold sore 07/13/2024   Acute cholecystitis 06/17/2022   Acute unilateral obstructive uropathy 06/17/2022   Chronic obstructive pulmonary disease (COPD) (HCC) 06/17/2022   Dyslipidemia 06/17/2022   Rheumatoid arthritis involving multiple sites (HCC) 12/24/2020   Prediabetes 12/24/2020    Past Medical History:  Diagnosis Date   Anemia 1970s - 1980s   COPD (chronic obstructive pulmonary disease) (HCC)    Depression 1975ish?   Diabetes mellitus without complication (HCC) 2021   diagnosed prediabetes   Emphysema of lung (HCC) 2017   History of kidney stones    Hyperlipidemia 2021   Kidney stone    Osteoporosis    Pre-diabetes    Rheumatoid arthritis (HCC)    Substance abuse (HCC) 1973   marijuana occasional use    Family History  Problem Relation Age of Onset   Diabetes Mother    Osteoarthritis Mother    Arthritis Mother    Kidney disease Mother    Obesity Mother    Vision loss Mother    Heart disease Father    Alcohol abuse Father    Depression Father    Diabetes Sister    Fibromyalgia Sister    Meniere's disease Brother    Depression Brother    Healthy Daughter    Healthy Son    Healthy Son    Seizures Son        only had one 6 years ago   Epilepsy Nephew    Alcohol abuse Sister    Depression Sister    Diabetes Sister    Drug abuse Sister    Obesity Sister    Breast cancer Neg Hx    Past Surgical History:  Procedure Laterality Date   APPENDECTOMY     BREAST BIOPSY Left    2023   CESAREAN SECTION     CHOLECYSTECTOMY     CYSTOSCOPY WITH STENT PLACEMENT Left 06/17/2022   Procedure: CYSTOSCOPY WITH STENT PLACEMENT;  Surgeon: Francisca Redell BROCKS, MD;  Location: [facility];  Service: Urology;  Laterality: Left;   CYSTOSCOPY/URETEROSCOPY/HOLMIUM LASER/STENT  PLACEMENT Left 07/03/2022   Procedure: CYSTOSCOPY/URETEROSCOPY/HOLMIUM LASER/STENT EXCHANGE;  Surgeon: Francisca Redell BROCKS, MD;  Location: [facility];  Service: Urology;  Laterality: Left;   FRACTURE SURGERY  March 2023   broken ankle   MULTIPLE TOOTH EXTRACTIONS     all teeth have been removed, patient wears dentures    ORIF ANKLE FRACTURE Right 03/09/2022   Procedure: OPEN REDUCTION INTERNAL FIXATION (ORIF) ANKLE FRACTURE;  Surgeon: Leora Lynwood SAUNDERS, MD;  Location: [facility];  Service: Orthopedics;  Laterality: Right;   TUBAL LIGATION     Social History   Tobacco Use   Smoking status: Former    Current packs/day: 0.00    Types: Cigarettes    Quit date: 04/2019    Years since quitting: 5.5    Passive exposure: Past   Smokeless tobacco: Never  Vaping Use   Vaping status: Never Used  Substance  Use Topics   Alcohol use: Yes    Comment: occassionally   Drug use: Yes    Types: Marijuana    Comment: 5 days weekly/gummies   Social History   Social History Narrative   Not on file     Immunization History  Administered Date(s) Administered   Influenza,inj,Quad PF,6+ Mos 12/24/2020   Moderna Sars-Covid-2 Vaccination 03/20/2020, 04/18/2020, 07/05/2020     Objective: Vital Signs: BP 116/70 (BP Location: Right Arm, Patient Position: Sitting, Cuff Size: Normal)   Pulse (!) 55   Temp 97.7 F (36.5 C)   Resp 16   Ht 5' 5.5 (1.664 m)   Wt 209 lb (94.8 kg)   BMI 34.25 kg/m    Physical Exam HENT:     Mouth/Throat:     Mouth: Mucous membranes are moist.     Pharynx: Oropharynx is clear.  Eyes:     Conjunctiva/sclera: Conjunctivae normal.  Cardiovascular:     Rate and Rhythm: Normal rate and regular rhythm.  Pulmonary:     Effort: Pulmonary effort is normal.     Breath sounds: Normal breath sounds.  Lymphadenopathy:     Cervical: No cervical adenopathy.  Skin:    General: Skin is warm and dry.     Findings: No rash.     Comments: Superficial tortuous veins in b/l ankles  and feet, no rashes 1+ pitting edema at ankle and below, wearing compression socks  Neurological:     Mental Status: She is alert.  Psychiatric:        Mood and Affect: Mood normal.      Musculoskeletal Exam:  Neck full ROM no tenderness Shoulders full ROM no tenderness or swelling Elbows full ROM no tenderness or swelling Wrists full ROM no tenderness or swelling Fingers chronic widening at 2nd-3rd MCPs, slightly decreased MCP extension ROM, left hand joint thickening between MCPs without subluxation or decreased ROM No paraspinal tenderness to palpation over upper and lower back Hip normal internal and external rotation without pain, mild lateral hip tenderness to pressure b/l Knees full ROM no tenderness or swelling, right knee patellofemoral crepitus Ankles full ROM no tenderness or swelling Right 1st MTP fused    Investigation: No additional findings.  Imaging: DG Bone Density Result Date: 10/31/2024 EXAM: DUAL X-RAY ABSORPTIOMETRY (DXA) FOR BONE MINERAL DENSITY 10/31/2024 1:38 pm CLINICAL DATA:  63 year old Female Postmenopausal. Screening for osteoporosis History of fragility fracture. Patient is or has been on glucocorticoid therapy. TECHNIQUE: An axial (e.g., hips, spine) and/or appendicular (e.g., radius) exam was performed, as appropriate, using GE Secretary/administrator at [facility]. Images are obtained for bone mineral density measurement and are not obtained for diagnostic purposes. [id number] Exclusions: None. COMPARISON:  None. FINDINGS: Scan quality: Good. LUMBAR SPINE (L1-L4): BMD (in g/cm2): 1.044 T-score: -1.2 Z-score: 0.3 LEFT FEMORAL NECK: BMD (in g/cm2): 0.836 T-score: -1.5 Z-score: 0.0 LEFT TOTAL HIP: BMD (in g/cm2): 0.894 T-score: -0.9 Z-score: 0.2 RIGHT FEMORAL NECK: BMD (in g/cm2): 0.781 T-score: -1.8 Z-score: -0.4 RIGHT TOTAL HIP: BMD (in g/cm2): 0.820 T-score: -1.5 Z-score: -0.3 FRAX 10-YEAR PROBABILITY OF FRACTURE: 10-year fracture  risk is performed using the [employer] FRAX calculator based on patient-reported risk factors. Major osteoporotic fracture: 30.7% Hip fracture: 5.3% Other situations known to alter the reliability of the FRAX score should be considered when making treatment decisions, including chronic glucocorticoid use and past treatments. Further guidance on treatment can be found at the [facility]'s website https://www.patton.com/. IMPRESSION: Osteopenia  based on BMD. Fracture risk is increased. Increased risk is based on low BMD, FRAX calculation and history of fragility fracture. RECOMMENDATIONS: 1. All patients should optimize calcium  and vitamin D  intake. 2. Consider FDA-approved medical therapies in postmenopausal women and men aged 58 years and older, based on the following: - A hip or vertebral (clinical or morphometric) fracture - T-score less than or equal to -2.5 and secondary causes have been excluded. - Low bone mass (T-score between -1.0 and -2.5) and a 10-year probability of a hip fracture greater than or equal to 3% or a 10-year probability of a major osteoporosis-related fracture greater than or equal to 20% based on the [place] -adapted WHO algorithm. - Clinician judgment and/or patient preferences may indicate treatment for people with 10-year fracture probabilities above or below these levels 3. Patients with diagnosis of osteoporosis or at high risk for fracture should have regular bone mineral density tests. For patients eligible for Medicare, routine testing is allowed once every 2 years. The testing frequency can be increased to one year for patients who have rapidly progressing disease, those who are receiving or discontinuing medical therapy to restore bone mass, or have additional risk factors. Electronically Signed   By: Harrietta Sherry M.D.   On: 10/31/2024 15:54   MM 3D DIAGNOSTIC MAMMOGRAM BILATERAL BREAST Result Date: 10/12/2024 CLINICAL DATA:  Reported LATERAL LEFT breast  swelling, mass, tenderness and erythema seen by clinician. Now nearly resolved without antibiotics (NSAIDs recommended by clinician). Patient unable to identify the exact location but can identify the general area. Due for annual. EXAM: DIGITAL DIAGNOSTIC BILATERAL MAMMOGRAM WITH TOMOSYNTHESIS AND CAD; ULTRASOUND LEFT BREAST LIMITED TECHNIQUE: Bilateral digital diagnostic mammography and breast tomosynthesis was performed. The images were evaluated with computer-aided detection. ; Targeted ultrasound examination of the left breast was performed. COMPARISON:  Previous exam(s). ACR Breast Density Category b: There are scattered areas of fibroglandular density. FINDINGS: Spot compression tomosynthesis views were obtained over the general area of now improved focal pain in the LEFT breast. No suspicious mammographic finding is identified in this area. No suspicious mass, microcalcification, or other finding is identified in EITHER breast. Targeted LEFT breast ultrasound was performed in the area of pain at the entire LATERAL and lower outer LEFT breast from 1-6 o'clock. No suspicious solid or cystic mass is identified. Representative negative pictures were taken. IMPRESSION: No mammographic or sonographic etiology for now improved focal LEFTbreast painidentified. There is no mammographic evidence of malignancy in EITHER breast. Breast pain is a common condition, which will often resolve on its own without intervention. It can be affected by hormonal changes, medication side effect, weight changes and fit of the bra. Pain may also be referred from other adjacent areas of the body. Breast pain may be improved by wearing adequate well-fitting support, over-the-counter topical and oral NSAID medication, low-fat diet, and ice/heat as needed. Studies have shown an improvement in cyclic pain with use of evening primrose oil, vitamin D  and vitamin E. Recommend discussion with your doctor or other provider before starting any  over the counter supplements. Clinical follow-up recommended to discuss any further work-up recommendations and appropriate treatment, which should be based on the clinical assessment. Findings and recommendations were discussed with the patient in person. RECOMMENDATION: Clinical follow-up of LEFT breast pain. Patient may return to routine screening mammogram in 1 year.(Code:SM-B-01Y) I have discussed the findings and recommendations with the patient. If applicable, a reminder letter will be sent to the patient regarding the next appointment. BI-RADS CATEGORY  1: Negative. Electronically Signed   By: Norleen Croak M.D.   On: 10/12/2024 14:57   US  LIMITED ULTRASOUND INCLUDING AXILLA LEFT BREAST  Result Date: 10/12/2024 CLINICAL DATA:  Reported LATERAL LEFT breast swelling, mass, tenderness and erythema seen by clinician. Now nearly resolved without antibiotics (NSAIDs recommended by clinician). Patient unable to identify the exact location but can identify the general area. Due for annual. EXAM: DIGITAL DIAGNOSTIC BILATERAL MAMMOGRAM WITH TOMOSYNTHESIS AND CAD; ULTRASOUND LEFT BREAST LIMITED TECHNIQUE: Bilateral digital diagnostic mammography and breast tomosynthesis was performed. The images were evaluated with computer-aided detection. ; Targeted ultrasound examination of the left breast was performed. COMPARISON:  Previous exam(s). ACR Breast Density Category b: There are scattered areas of fibroglandular density. FINDINGS: Spot compression tomosynthesis views were obtained over the general area of now improved focal pain in the LEFT breast. No suspicious mammographic finding is identified in this area. No suspicious mass, microcalcification, or other finding is identified in EITHER breast. Targeted LEFT breast ultrasound was performed in the area of pain at the entire LATERAL and lower outer LEFT breast from 1-6 o'clock. No suspicious solid or cystic mass is identified. Representative negative pictures were  taken. IMPRESSION: No mammographic or sonographic etiology for now improved focal LEFTbreast painidentified. There is no mammographic evidence of malignancy in EITHER breast. Breast pain is a common condition, which will often resolve on its own without intervention. It can be affected by hormonal changes, medication side effect, weight changes and fit of the bra. Pain may also be referred from other adjacent areas of the body. Breast pain may be improved by wearing adequate well-fitting support, over-the-counter topical and oral NSAID medication, low-fat diet, and ice/heat as needed. Studies have shown an improvement in cyclic pain with use of evening primrose oil, vitamin D  and vitamin E. Recommend discussion with your doctor or other provider before starting any over the counter supplements. Clinical follow-up recommended to discuss any further work-up recommendations and appropriate treatment, which should be based on the clinical assessment. Findings and recommendations were discussed with the patient in person. RECOMMENDATION: Clinical follow-up of LEFT breast pain. Patient may return to routine screening mammogram in 1 year.(Code:SM-B-01Y) I have discussed the findings and recommendations with the patient. If applicable, a reminder letter will be sent to the patient regarding the next appointment. BI-RADS CATEGORY  1: Negative. Electronically Signed   By: Norleen Croak M.D.   On: 10/12/2024 14:57    Recent Labs: Lab Results  Component Value Date   WBC 5.5 08/04/2024   HGB 11.9 08/04/2024   PLT 321 08/04/2024   NA 139 08/04/2024   K 4.7 08/04/2024   CL 101 08/04/2024   CO2 23 08/04/2024   GLUCOSE 103 (H) 08/04/2024   BUN 15 08/04/2024   CREATININE 1.11 (H) 08/04/2024   BILITOT 0.4 08/04/2024   ALKPHOS 67 08/04/2024   AST 16 08/04/2024   ALT 25 08/04/2024   PROT 7.7 08/04/2024   ALBUMIN 4.8 08/04/2024   CALCIUM  9.6 08/04/2024   GFRAA 91 01/22/2021   QFTBGOLDPLUS NEGATIVE 01/22/2021     Speciality Comments: MTX+PLQ+Prednisonex 36yrs.,Remicade +MTX x 73yrs, Orenciax1 only, Xeljanz  + MTX 6tabs po qwk since2018.  Procedures:  No procedures performed Allergies: Patient has no known allergies.   Assessment / Plan:     Visit Diagnoses: Rheumatoid arthritis involving multiple sites, unspecified whether rheumatoid factor present (HCC) - Plan: XR Hand 2 View Right, XR Hand 2 View Left, XR Foot 2 Views Right, XR Foot 2 Views Left,  Sedimentation rate, C-reactive protein Chronic rheumatoid arthritis with hand and foot involvement. No active joint swelling, some joint damage. Prefers oral medications. No major side effects from Rinvoq  and methotrexate . Regular monitoring required. - Ordered x-ray of hands for joint damage assessment. - Checked blood tests including blood count and metabolic panel. - Continue Rinvoq  15 mg daily. - Continue methotrexate  7 pills weekly. - Monitor blood count and metabolic panel every three months. - Screen for tuberculosis annually.  High risk medication use - Plan: CBC with Differential/Platelet, Comprehensive metabolic panel with GFR, QuantiFERON-TB Gold Plus Reviewed risks of medication including infections, cytopenias, hepatotoxicity, malignancy, MACE or VTE. No previous existing contraindication. Recent lipid panel from 07/2024 reviewed.  -Checking CBC and CMP and TB screening for medication monitoring on rinvoq  and methotrexate   Age-related osteoporosis without current pathological fracture Managed with Fosamax . Awaiting recent bone density test results, on review appears to remain at high risk of fracture so would continue therapy if duration has been 5 or less years. Compliant with weekly Fosamax .  Sicca syndrome Reports dry mouth and eyes, difficulty staying hydrated. I don't see any major complications of dryness in eye irritation or vision change. - Encouraged increased hydration.   Chronic venous insufficiency with lower extremity  edema Managed with compression socks and minimal salt intake. No history of clots or blockages. Recent right ankle surgery in 2023. - Continue use of compression socks. - Encouraged minimal salt intake.    Orders: Orders Placed This Encounter  Procedures   XR Hand 2 View Right   XR Hand 2 View Left   XR Foot 2 Views Right   XR Foot 2 Views Left   Sedimentation rate   C-reactive protein   CBC with Differential/Platelet   Comprehensive metabolic panel with GFR   QuantiFERON-TB Gold Plus   Meds ordered this encounter  Medications   methotrexate  (RHEUMATREX) 2.5 MG tablet    Sig: Take 7 tablets (17.5 mg total) by mouth once a week. Caution:Chemotherapy. Protect from light.    Dispense:  84 tablet    Refill:  0   Upadacitinib  ER (RINVOQ ) 15 MG TB24    Sig: Take 1 tablet (15 mg total) by mouth daily.    Dispense:  90 tablet    Refill:  0   alendronate  (FOSAMAX ) 70 MG tablet    Sig: Take 1 tablet (70 mg total) by mouth once a week. Take with a full glass of water on an empty stomach.    Dispense:  12 tablet    Refill:  0   folic acid  (FOLVITE ) 1 MG tablet    Sig: Take 2 tablets (2 mg total) by mouth daily.    Dispense:  180 tablet    Refill:  3   I personally spent a total of 68 minutes in the care of the patient today including preparing to see the patient, getting/reviewing separately obtained history, performing a medically appropriate exam/evaluation, counseling and educating, placing orders, referring and communicating with other health care professionals, documenting clinical information in the EHR, independently interpreting results, and communicating results.   Follow-Up Instructions: Return in about 3 months (around 02/06/2025) for New pt RA UPA/MTX f/u 3mos.   Lonni LELON Ester, MD  Note - This record has been created using [employer].  Chart creation errors have been sought, but may not always  have been located. Such creation errors do not reflect on  the  standard of medical care.

## 2024-11-06 NOTE — Patient Instructions (Signed)
 Lori Banks

## 2024-11-07 NOTE — Progress Notes (Signed)
Pt informed

## 2024-11-08 LAB — CBC WITH DIFFERENTIAL/PLATELET
Absolute Lymphocytes: 1622 {cells}/uL (ref 850–3900)
Absolute Monocytes: 302 {cells}/uL (ref 200–950)
Basophils Absolute: 21 {cells}/uL (ref 0–200)
Basophils Relative: 0.4 %
Eosinophils Absolute: 62 {cells}/uL (ref 15–500)
Eosinophils Relative: 1.2 %
HCT: 38.3 % (ref 35.9–46.0)
Hemoglobin: 12.4 g/dL (ref 11.7–15.5)
MCH: 30.4 pg (ref 27.0–33.0)
MCHC: 32.4 g/dL (ref 31.6–35.4)
MCV: 93.9 fL (ref 81.4–101.7)
MPV: 10.8 fL (ref 7.5–12.5)
Monocytes Relative: 5.8 %
Neutro Abs: 3193 {cells}/uL (ref 1500–7800)
Neutrophils Relative %: 61.4 %
Platelets: 244 Thousand/uL (ref 140–400)
RBC: 4.08 Million/uL (ref 3.80–5.10)
RDW: 14.5 % (ref 11.0–15.0)
Total Lymphocyte: 31.2 %
WBC: 5.2 Thousand/uL (ref 3.8–10.8)

## 2024-11-08 LAB — COMPREHENSIVE METABOLIC PANEL WITH GFR
AG Ratio: 1.7 (calc) (ref 1.0–2.5)
ALT: 22 U/L (ref 6–29)
AST: 25 U/L (ref 10–35)
Albumin: 4.2 g/dL (ref 3.6–5.1)
Alkaline phosphatase (APISO): 32 U/L — ABNORMAL LOW (ref 37–153)
BUN: 18 mg/dL (ref 7–25)
CO2: 27 mmol/L (ref 20–32)
Calcium: 9.9 mg/dL (ref 8.6–10.4)
Chloride: 105 mmol/L (ref 98–110)
Creat: 0.77 mg/dL (ref 0.50–1.05)
Globulin: 2.5 g/dL (ref 1.9–3.7)
Glucose, Bld: 92 mg/dL (ref 65–99)
Potassium: 4.4 mmol/L (ref 3.5–5.3)
Sodium: 140 mmol/L (ref 135–146)
Total Bilirubin: 0.5 mg/dL (ref 0.2–1.2)
Total Protein: 6.7 g/dL (ref 6.1–8.1)
eGFR: 86 mL/min/1.73m2 (ref >=60)

## 2024-11-08 LAB — SEDIMENTATION RATE: Sed Rate: 28 mm/h (ref 0–30)

## 2024-11-08 LAB — QUANTIFERON-TB GOLD PLUS
Mitogen-NIL: 8.34 [IU]/mL
NIL: 0.01 [IU]/mL
QuantiFERON-TB Gold Plus: NEGATIVE
TB1-NIL: 0 [IU]/mL
TB2-NIL: 0 [IU]/mL

## 2024-11-08 LAB — C-REACTIVE PROTEIN: CRP: 10.7 mg/L — ABNORMAL HIGH (ref <8.0)

## 2024-12-18 ENCOUNTER — Encounter: Payer: Self-pay | Admitting: Internal Medicine

## 2024-12-18 ENCOUNTER — Ambulatory Visit (INDEPENDENT_AMBULATORY_CARE_PROVIDER_SITE_OTHER): Payer: Medicare (Managed Care) | Admitting: Internal Medicine

## 2024-12-18 VITALS — BP 116/82 | HR 68 | Ht 66.0 in | Wt 209.6 lb

## 2024-12-18 DIAGNOSIS — R7303 Prediabetes: Secondary | ICD-10-CM

## 2024-12-18 DIAGNOSIS — J449 Chronic obstructive pulmonary disease, unspecified: Secondary | ICD-10-CM

## 2024-12-18 DIAGNOSIS — E538 Deficiency of other specified B group vitamins: Secondary | ICD-10-CM | POA: Insufficient documentation

## 2024-12-18 DIAGNOSIS — E6609 Other obesity due to excess calories: Secondary | ICD-10-CM | POA: Diagnosis not present

## 2024-12-18 DIAGNOSIS — E559 Vitamin D deficiency, unspecified: Secondary | ICD-10-CM | POA: Insufficient documentation

## 2024-12-18 DIAGNOSIS — E782 Mixed hyperlipidemia: Secondary | ICD-10-CM

## 2024-12-18 DIAGNOSIS — Z6833 Body mass index (BMI) 33.0-33.9, adult: Secondary | ICD-10-CM | POA: Diagnosis not present

## 2024-12-18 DIAGNOSIS — R5382 Chronic fatigue, unspecified: Secondary | ICD-10-CM | POA: Insufficient documentation

## 2024-12-18 DIAGNOSIS — E66811 Obesity, class 1: Secondary | ICD-10-CM | POA: Diagnosis not present

## 2024-12-18 DIAGNOSIS — Z0130 Encounter for examination of blood pressure without abnormal findings: Secondary | ICD-10-CM

## 2024-12-18 DIAGNOSIS — M0579 Rheumatoid arthritis with rheumatoid factor of multiple sites without organ or systems involvement: Secondary | ICD-10-CM | POA: Diagnosis not present

## 2024-12-18 NOTE — Progress Notes (Signed)
 "  Established Patient Office Visit  Subjective:  Patient ID: Lori Banks, female    DOB: 1961/07/12  Age: 64 y.o. MRN: [id number]  Chief Complaint  Patient presents with   Follow-up    4 month follow up    Patient is here today for follow up. She reports feeling well today and has no new complaints at this time.  She was recently seen 09/2024 and had a lump on left breast. Dx mammogram and US  was ordered. At the time of imaging Lump had resolved. Recommend patient continue routine once yearly screening at this time. She denies any issues at this time.  She reports feeling tired and sleeping long periods of time. She reports feeling groggy in the mornings. She is unsure if it is depression but she states she does not feel depressed at this time and has good coping mechanisms she has learned over the years to deal with her mental health. She endorses snoring. Denies morning headaches or difficulty staying awake throughout the day. Will check routine blood work when patient is fasting. Discussed potential sleep study. Recommended proper sleep hygiene techniques at least 1-2 hours prior to bedtime and setting set awake and bedtime schedule.  She reports taking her medications as prescribed and denies any chest pain, shortness of breath, headaches, dizziness, abdominal distress at this time.    No other concerns at this time.   Past Medical History:  Diagnosis Date   Anemia 1970s - 1980s   COPD (chronic obstructive pulmonary disease) (HCC)    Depression 1975ish?   Diabetes mellitus without complication (HCC) 2021   diagnosed prediabetes   Emphysema of lung (HCC) 2017   History of kidney stones    Hyperlipidemia 2021   Kidney stone    Osteoporosis    Pre-diabetes    Rheumatoid arthritis (HCC)    Substance abuse (HCC) 1973   marijuana occasional use    Past Surgical History:  Procedure Laterality Date   APPENDECTOMY     BREAST BIOPSY Left    2023   CESAREAN  SECTION     CHOLECYSTECTOMY     CYSTOSCOPY WITH STENT PLACEMENT Left 06/17/2022   Procedure: CYSTOSCOPY WITH STENT PLACEMENT;  Surgeon: Francisca Redell BROCKS, MD;  Location: [facility];  Service: Urology;  Laterality: Left;   CYSTOSCOPY/URETEROSCOPY/HOLMIUM LASER/STENT PLACEMENT Left 07/03/2022   Procedure: CYSTOSCOPY/URETEROSCOPY/HOLMIUM LASER/STENT EXCHANGE;  Surgeon: Francisca Redell BROCKS, MD;  Location: [facility];  Service: Urology;  Laterality: Left;   FRACTURE SURGERY  March 2023   broken ankle   MULTIPLE TOOTH EXTRACTIONS     all teeth have been removed, patient wears dentures    ORIF ANKLE FRACTURE Right 03/09/2022   Procedure: OPEN REDUCTION INTERNAL FIXATION (ORIF) ANKLE FRACTURE;  Surgeon: Leora Lynwood SAUNDERS, MD;  Location: [facility];  Service: Orthopedics;  Laterality: Right;   TUBAL LIGATION      Social History   Socioeconomic History   Marital status: Legally Separated    Spouse name: Not on file   Number of children: Not on file   Years of education: Not on file   Highest education level: Not on file  Occupational History   Not on file  Tobacco Use   Smoking status: Former    Current packs/day: 0.00    Types: Cigarettes    Quit date: 04/2019    Years since quitting: 5.6    Passive exposure: Past   Smokeless tobacco: Never  Vaping Use   Vaping status: Never Used  Substance  and Sexual Activity   Alcohol use: Yes    Comment: occassionally   Drug use: Yes    Types: Marijuana    Comment: 5 days weekly/gummies   Sexual activity: Not Currently  Other Topics Concern   Not on file  Social History Narrative   Not on file   Social Drivers of Health   Tobacco Use: Medium Risk (12/18/2024)   Patient History    Smoking Tobacco Use: Former    Smokeless Tobacco Use: Never    Passive Exposure: Past  Physicist, Medical Strain: Not on [employer] Insecurity: Not on file  Transportation Needs: Not on file  Physical Activity: Not on file  Stress: Not on file  Social Connections: Not  on file  Intimate Partner Violence: Not At Risk (07/03/2024)   Received from [facility]   Epic    Within the last year, have you been afraid of your partner or ex-partner?: No    Within the last year, have you been humiliated or emotionally abused in other ways by your partner or ex-partner?: No    Within the last year, have you been kicked, hit, slapped, or otherwise physically hurt by your partner or ex-partner?: No    Within the last year, have you been raped or forced to have any kind of sexual activity by your partner or ex-partner?: No  Depression (PHQ2-9): Low Risk (08/04/2024)   Depression (PHQ2-9)    PHQ-2 Score: 3  Recent Concern: Depression (PHQ2-9) - Medium Risk (07/13/2024)   Depression (PHQ2-9)    PHQ-2 Score: 5  Alcohol Screen: Not on file  Housing: Unknown (01/17/2024)   Received from [facility]   Epic    Unable to Pay for Housing in the Last Year: Not on file    Number of Times Moved in the Last Year: Not on file    At any time in the past 12 months, were you homeless or living in a shelter (including now)?: No  Utilities: Not on file  Health Literacy: Not on file    Family History  Problem Relation Age of Onset   Diabetes Mother    Osteoarthritis Mother    Arthritis Mother    Kidney disease Mother    Obesity Mother    Vision loss Mother    Heart disease Father    Alcohol abuse Father    Depression Father    Diabetes Sister    Fibromyalgia Sister    Meniere's disease Brother    Depression Brother    Healthy Daughter    Healthy Son    Healthy Son    Seizures Son        only had one 6 years ago   Epilepsy Nephew    Alcohol abuse Sister    Depression Sister    Diabetes Sister    Drug abuse Sister    Obesity Sister    Breast cancer Neg Hx     Allergies[1]  Show/hide medication list[2]  Review of Systems  Constitutional:  Positive for malaise/fatigue. Negative for chills and fever.  HENT: Negative.  Negative for congestion and  sore throat.   Eyes: Negative.  Negative for blurred vision and pain.  Respiratory: Negative.  Negative for cough and shortness of breath.   Cardiovascular: Negative.  Negative for chest pain, palpitations and leg swelling.  Gastrointestinal: Negative.  Negative for abdominal pain, blood in stool, constipation, diarrhea, heartburn, melena, nausea and vomiting.  Genitourinary: Negative.  Negative for dysuria, flank  pain, frequency and urgency.  Musculoskeletal: Negative.  Negative for joint pain and myalgias.  Skin: Negative.   Neurological: Negative.  Negative for dizziness, tingling, sensory change, weakness and headaches.  Endo/Heme/Allergies: Negative.   Psychiatric/Behavioral:  Negative for depression and suicidal ideas. The patient has insomnia. The patient is not nervous/anxious.        Objective:   BP 116/82   Pulse 68   Ht 5' 6 (1.676 m)   Wt 209 lb 9.6 oz (95.1 kg)   SpO2 99%   BMI 33.83 kg/m   Vitals:   12/18/24 1321  BP: 116/82  Pulse: 68  Height: 5' 6 (1.676 m)  Weight: 209 lb 9.6 oz (95.1 kg)  SpO2: 99%  BMI (Calculated): 33.85    Physical Exam Vitals and nursing note reviewed.  Constitutional:      Appearance: Normal appearance.  HENT:     Head: Normocephalic and atraumatic.     Nose: Nose normal.     Mouth/Throat:     Mouth: Mucous membranes are moist.     Pharynx: Oropharynx is clear.  Eyes:     Conjunctiva/sclera: Conjunctivae normal.     Pupils: Pupils are equal, round, and reactive to light.  Cardiovascular:     Rate and Rhythm: Normal rate and regular rhythm.     Pulses: Normal pulses.     Heart sounds: Normal heart sounds. No murmur heard. Pulmonary:     Effort: Pulmonary effort is normal.     Breath sounds: Normal breath sounds. No wheezing.  Abdominal:     General: Bowel sounds are normal.     Palpations: Abdomen is soft.     Tenderness: There is no abdominal tenderness. There is no right CVA tenderness or left CVA tenderness.   Musculoskeletal:        General: Normal range of motion.     Cervical back: Normal range of motion.     Right lower leg: No edema.     Left lower leg: No edema.  Skin:    General: Skin is warm and dry.  Neurological:     General: No focal deficit present.     Mental Status: She is alert and oriented to person, place, and time.  Psychiatric:        Mood and Affect: Mood normal.        Behavior: Behavior normal.      Results for orders placed or performed in visit on 12/18/24  HM PAP SMEAR  Result Value Ref Range   HM Pap smear normal   Results Console HPV  Result Value Ref Range   CHL HPV Negative     Recent Results (from the past 2160 hours)  Sedimentation rate     Status: None   Collection Time: 11/06/24  2:30 PM  Result Value Ref Range   Sed Rate 28 0 - 30 mm/h  C-reactive protein     Status: Abnormal   Collection Time: 11/06/24  2:30 PM  Result Value Ref Range   CRP 10.7 (H) <8.0 mg/L  CBC with Differential/Platelet     Status: None   Collection Time: 11/06/24  2:30 PM  Result Value Ref Range   WBC 5.2 3.8 - 10.8 Thousand/uL   RBC 4.08 3.80 - 5.10 Million/uL   Hemoglobin 12.4 11.7 - 15.5 g/dL   HCT 61.6 64.0 - 53.9 %   MCV 93.9 81.4 - 101.7 fL   MCH 30.4 27.0 - 33.0 pg   MCHC 32.4 31.6 -  35.4 g/dL    Comment: For adults, a slight decrease in the calculated MCHC value (in the range of 30 to 32 g/dL) is most likely not clinically significant; however, it should be interpreted with caution in correlation with other red cell parameters and the patient's clinical condition.    RDW 14.5 11.0 - 15.0 %   Platelets 244 140 - 400 Thousand/uL   MPV 10.8 7.5 - 12.5 fL   Neutro Abs 3,193 1,500 - 7,800 cells/uL   Absolute Lymphocytes 1,622 850 - 3,900 cells/uL   Absolute Monocytes 302 200 - 950 cells/uL   Eosinophils Absolute 62 15 - 500 cells/uL   Basophils Absolute 21 0 - 200 cells/uL   Neutrophils Relative % 61.4 %   Total Lymphocyte 31.2 %   Monocytes Relative  5.8 %   Eosinophils Relative 1.2 %   Basophils Relative 0.4 %  Comprehensive metabolic panel with GFR     Status: Abnormal   Collection Time: 11/06/24  2:30 PM  Result Value Ref Range   Glucose, Bld 92 65 - 99 mg/dL    Comment: .            Fasting reference interval .    BUN 18 7 - 25 mg/dL   Creat 9.22 9.49 - 8.94 mg/dL   eGFR 86 > OR = 60 fO/fpw/8.26f7   BUN/Creatinine Ratio SEE NOTE: 6 - 22 (calc)    Comment:    Not Reported: BUN and Creatinine are within    reference range. .    Sodium 140 135 - 146 mmol/L   Potassium 4.4 3.5 - 5.3 mmol/L   Chloride 105 98 - 110 mmol/L   CO2 27 20 - 32 mmol/L   Calcium  9.9 8.6 - 10.4 mg/dL   Total Protein 6.7 6.1 - 8.1 g/dL   Albumin 4.2 3.6 - 5.1 g/dL   Globulin 2.5 1.9 - 3.7 g/dL (calc)   AG Ratio 1.7 1.0 - 2.5 (calc)   Total Bilirubin 0.5 0.2 - 1.2 mg/dL   Alkaline phosphatase (APISO) 32 (L) 37 - 153 U/L   AST 25 10 - 35 U/L   ALT 22 6 - 29 U/L  QuantiFERON-TB Gold Plus     Status: None   Collection Time: 11/06/24  2:30 PM  Result Value Ref Range   QuantiFERON-TB Gold Plus NEGATIVE NEGATIVE    Comment: Negative test result. M. tuberculosis complex  infection unlikely.    NIL 0.01 IU/mL   Mitogen-NIL 8.34 IU/mL   TB1-NIL 0.00 IU/mL   TB2-NIL 0.00 IU/mL    Comment: . The Nil tube value reflects the background interferon gamma immune response of the patient's blood sample. This value has been subtracted from the patient's displayed TB and Mitogen results. . Lower than expected results with the Mitogen tube prevent false-negative Quantiferon readings by detecting a patient with a potential immune suppressive condition and/or suboptimal pre-analytical specimen handling. . The TB1 Antigen tube is coated with the M. tuberculosis-specific antigens designed to elicit responses from TB antigen primed CD4+ helper T-lymphocytes. . The TB2 Antigen tube is coated with the M. tuberculosis-specific antigens designed to  elicit responses from TB antigen primed CD4+ helper and CD8+ cytotoxic T-lymphocytes. . For additional information, please refer to https://education.[facility].com/faq/FAQ204 (This link is being provided for informational/ educational purposes only.) .       Assessment & Plan:  Continue medications as prescribed. Check routine blood work when fasting and will FU with patient on results. Discussed future sleep study. Patient  due for Medicare AWV. Reinforced healthy diet and exercise as tolerated. Problem List Items Addressed This Visit       Respiratory   Chronic obstructive pulmonary disease (COPD) (HCC)     Musculoskeletal and Integument   Rheumatoid arthritis involving multiple sites (HCC)     Other   Prediabetes   Relevant Orders   Hemoglobin A1c   Mixed hyperlipidemia - Primary   Relevant Orders   Lipid Panel w/o Chol/HDL Ratio   Vitamin D  deficiency   Relevant Orders   Vitamin D  (25 hydroxy)   B12 deficiency   Relevant Orders   Vitamin B12   Chronic fatigue   Relevant Orders   TSH   Class 1 obesity due to excess calories with serious comorbidity and body mass index (BMI) of 33.0 to 33.9 in adult    Return in about 3 months (around 03/18/2025) for medicare AWV.   Total time spent: 25 minutes. This time includes review of previous notes and results and patient face to face interaction during today's visit.    FERNAND FREDY RAMAN, MD  12/18/2024   This document may have been prepared by [facility] Voice Recognition software and as such may include unintentional dictation errors.     [1] No Known Allergies [2]  Outpatient Medications Prior to Visit  Medication Sig   alendronate  (FOSAMAX ) 70 MG tablet Take 1 tablet (70 mg total) by mouth once a week. Take with a full glass of water on an empty stomach.   atorvastatin  (LIPITOR) 20 MG tablet Take 1 tablet (20 mg total) by mouth daily.   Calcium  Carbonate-Vitamin D  (CALTRATE 600+D PO) Take by mouth.   diclofenac   Sodium (VOLTAREN ) 1 % GEL Apply 2 g topically 2 (two) times daily as needed (pain).   folic acid  (FOLVITE ) 1 MG tablet Take 2 tablets (2 mg total) by mouth daily.   methotrexate  (RHEUMATREX) 2.5 MG tablet Take 7 tablets (17.5 mg total) by mouth once a week. Caution:Chemotherapy. Protect from light.   Upadacitinib  ER (RINVOQ ) 15 MG TB24 Take 1 tablet (15 mg total) by mouth daily.   valACYclovir  (VALTREX ) 500 MG tablet Take 500 mg by mouth as needed.   No facility-administered medications prior to visit.   "

## 2024-12-25 ENCOUNTER — Other Ambulatory Visit: Payer: Medicare (Managed Care)

## 2024-12-26 ENCOUNTER — Ambulatory Visit: Payer: Self-pay | Admitting: Internal Medicine

## 2024-12-26 LAB — HEMOGLOBIN A1C
Est. average glucose Bld gHb Est-mCnc: 123 mg/dL
Hgb A1c MFr Bld: 5.9 % — ABNORMAL HIGH (ref 4.8–5.6)

## 2024-12-26 LAB — LIPID PANEL W/O CHOL/HDL RATIO
Cholesterol, Total: 131 mg/dL (ref 100–199)
HDL: 49 mg/dL
LDL Chol Calc (NIH): 58 mg/dL (ref 0–99)
Triglycerides: 136 mg/dL (ref 0–149)
VLDL Cholesterol Cal: 24 mg/dL (ref 5–40)

## 2024-12-26 LAB — VITAMIN B12: Vitamin B-12: 319 pg/mL (ref 232–1245)

## 2024-12-26 LAB — VITAMIN D 25 HYDROXY (VIT D DEFICIENCY, FRACTURES): Vit D, 25-Hydroxy: 34 ng/mL (ref 30.0–100.0)

## 2024-12-26 LAB — TSH: TSH: 2.24 u[IU]/mL (ref 0.450–4.500)

## 2025-01-01 ENCOUNTER — Ambulatory Visit: Payer: Medicare (Managed Care) | Admitting: Internal Medicine

## 2025-01-11 ENCOUNTER — Other Ambulatory Visit: Payer: Self-pay | Admitting: Internal Medicine

## 2025-01-11 DIAGNOSIS — M810 Age-related osteoporosis without current pathological fracture: Secondary | ICD-10-CM

## 2025-01-11 DIAGNOSIS — M069 Rheumatoid arthritis, unspecified: Secondary | ICD-10-CM

## 2025-01-11 NOTE — Telephone Encounter (Signed)
 Last Fill: 11/06/2024  Labs: 11/06/2024 alkaline phosphatase 32  Next Visit: 02/06/2025  Last Visit: 11/06/2024  DX: Rheumatoid arthritis involving multiple sites, unspecified whether rheumatoid factor present and Age-related osteoporosis without current pathological fracture   Current Dose per office note on 11/06/2024: methotrexate  7 pills weekly. Managed with Fosamax .   Okay to refill Methotrexate  and fosamax ?

## 2025-02-06 ENCOUNTER — Ambulatory Visit: Payer: Medicare (Managed Care) | Admitting: Internal Medicine

## 2025-03-27 ENCOUNTER — Ambulatory Visit: Payer: Medicare (Managed Care) | Admitting: Internal Medicine
